# Patient Record
Sex: Female | Born: 1955 | Race: White | Hispanic: No | Marital: Married | Smoking: Never smoker
Health system: Southern US, Community
[De-identification: ages and names within clinical notes are randomized; demographics above are authoritative.]

## PROBLEM LIST (undated history)

## (undated) DIAGNOSIS — M542 Cervicalgia: Secondary | ICD-10-CM

## (undated) DIAGNOSIS — N289 Disorder of kidney and ureter, unspecified: Secondary | ICD-10-CM

## (undated) DIAGNOSIS — M79603 Pain in arm, unspecified: Secondary | ICD-10-CM

## (undated) DIAGNOSIS — E119 Type 2 diabetes mellitus without complications: Secondary | ICD-10-CM

## (undated) DIAGNOSIS — I10 Essential (primary) hypertension: Secondary | ICD-10-CM

## (undated) DIAGNOSIS — K219 Gastro-esophageal reflux disease without esophagitis: Secondary | ICD-10-CM

## (undated) DIAGNOSIS — J45909 Unspecified asthma, uncomplicated: Secondary | ICD-10-CM

## (undated) DIAGNOSIS — R519 Headache, unspecified: Secondary | ICD-10-CM

## (undated) DIAGNOSIS — I471 Supraventricular tachycardia, unspecified: Secondary | ICD-10-CM

## (undated) DIAGNOSIS — E785 Hyperlipidemia, unspecified: Secondary | ICD-10-CM

## (undated) HISTORY — PX: APPENDECTOMY: SHX54

## (undated) HISTORY — DX: Disorder of kidney and ureter, unspecified: N28.9

## (undated) HISTORY — DX: Gastro-esophageal reflux disease without esophagitis: K21.9

## (undated) HISTORY — DX: Cervicalgia: M54.2

## (undated) HISTORY — DX: Supraventricular tachycardia, unspecified: I47.10

## (undated) HISTORY — DX: Headache, unspecified: R51.9

## (undated) HISTORY — PX: ABDOMINAL HYSTERECTOMY: SHX81

## (undated) HISTORY — DX: Supraventricular tachycardia: I47.1

## (undated) HISTORY — DX: Pain in arm, unspecified: M79.603

## (undated) HISTORY — PX: CHOLECYSTECTOMY: SHX55

## (undated) HISTORY — PX: TONSILLECTOMY: SUR1361

## (undated) HISTORY — DX: Hyperlipidemia, unspecified: E78.5

---

## 1998-11-16 ENCOUNTER — Other Ambulatory Visit: Admission: RE | Admit: 1998-11-16 | Discharge: 1998-11-16 | Payer: Self-pay | Admitting: Obstetrics and Gynecology

## 2002-09-14 ENCOUNTER — Other Ambulatory Visit: Admission: RE | Admit: 2002-09-14 | Discharge: 2002-09-14 | Payer: Self-pay | Admitting: Obstetrics and Gynecology

## 2008-08-03 ENCOUNTER — Emergency Department (HOSPITAL_BASED_OUTPATIENT_CLINIC_OR_DEPARTMENT_OTHER): Admission: EM | Admit: 2008-08-03 | Discharge: 2008-08-03 | Payer: Self-pay | Admitting: Emergency Medicine

## 2008-08-03 ENCOUNTER — Ambulatory Visit: Payer: Self-pay | Admitting: Diagnostic Radiology

## 2009-04-20 IMAGING — US MAMMO-LUNI-US
1 series · 7 of 7 positions shown · non-contrast
Comparison: NONE

CLINICAL DATA: Ahmui Alom, RT(R)(M)   Diagnostic 
Mammogram.  

LEFT BREAST MAMMOGRAM ADDITIONAL VIEWS AND LEFT BREAST ULTRASOUND

[Series 1: us breast · 0.06mm/px · 7 of 7 slices shown]
[im 1/7]
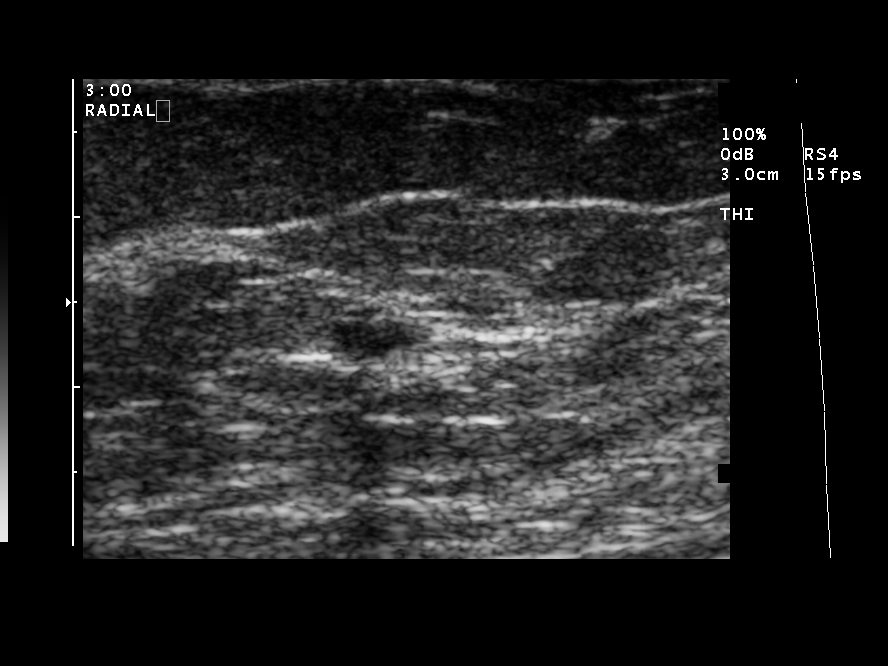
[im 2/7]
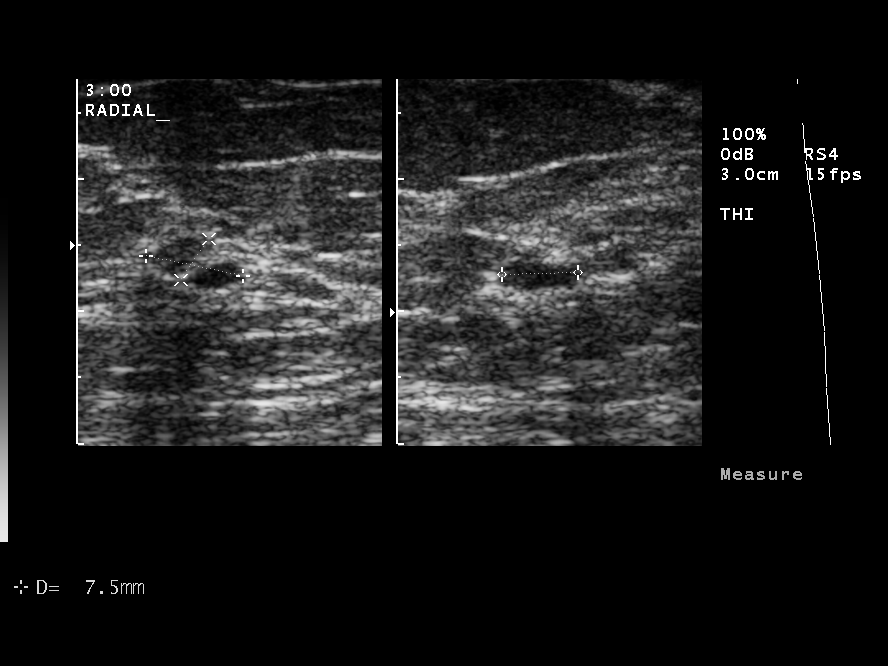
[im 3/7]
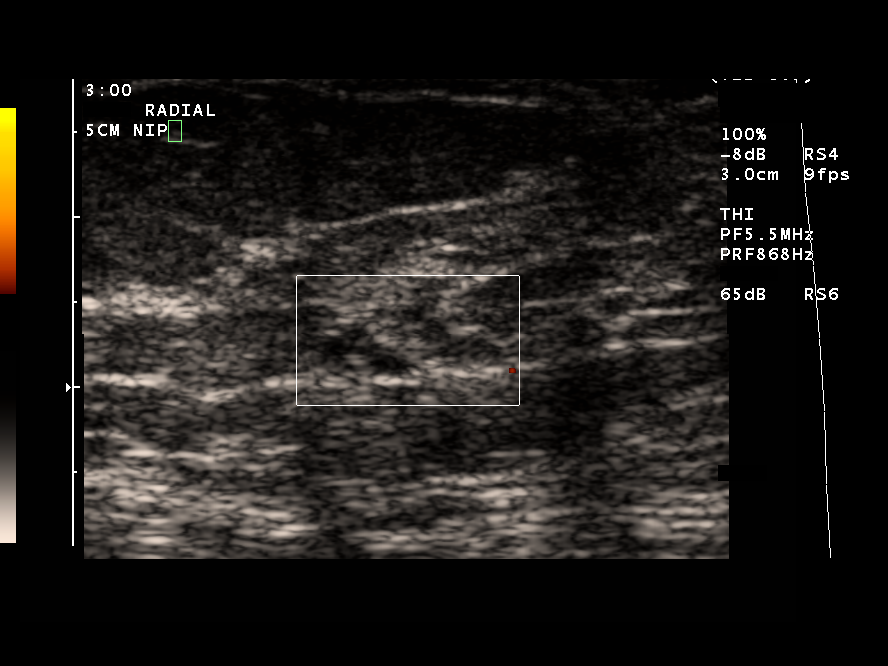
[im 4/7]
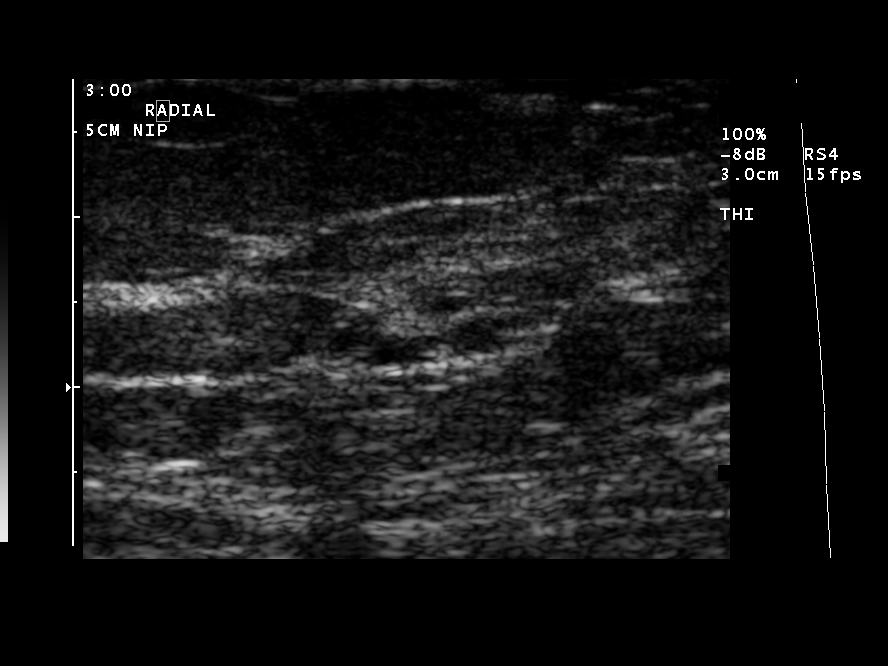
[im 5/7]
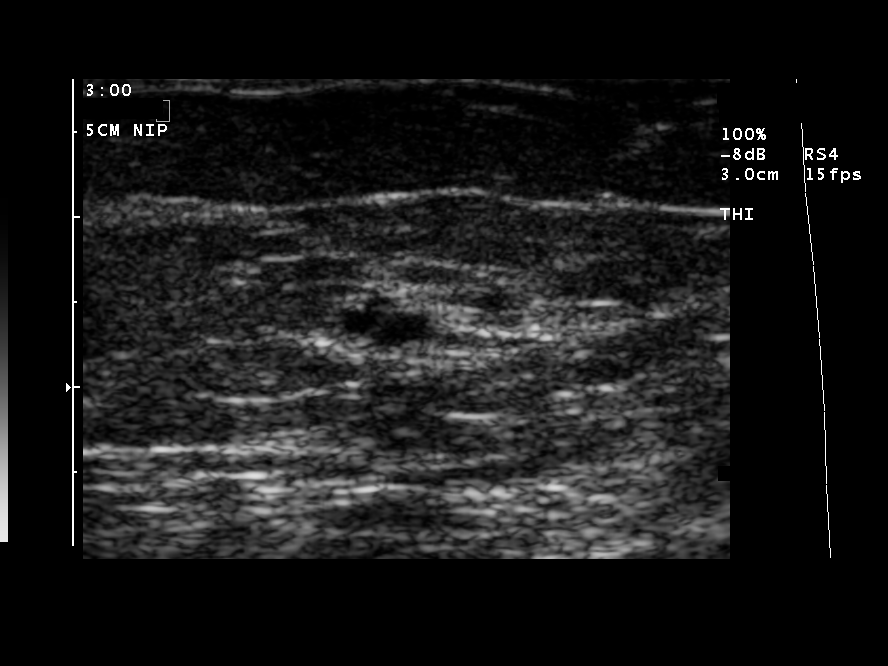
[im 6/7]
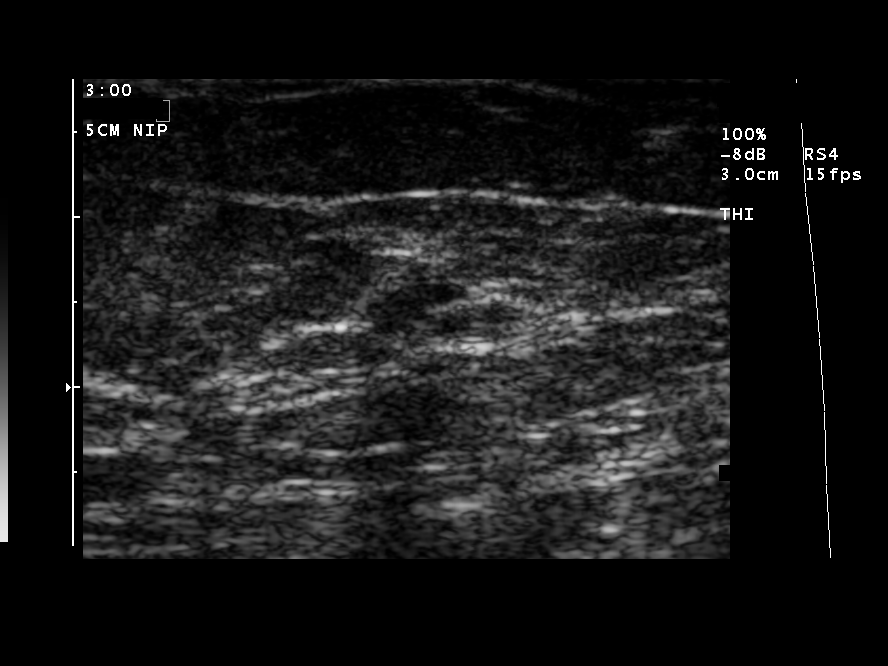
[im 7/7]
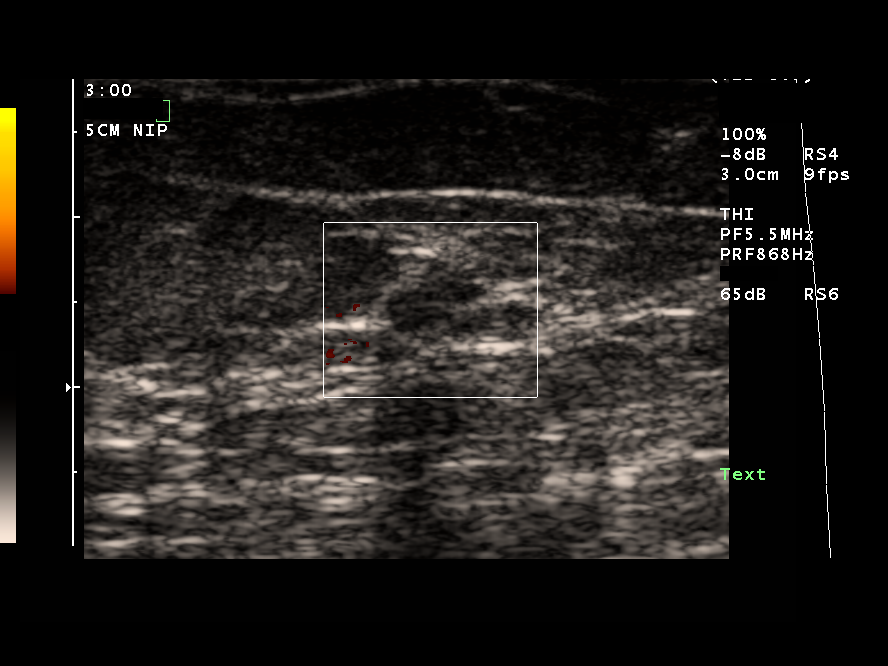

[7 of 7 positions shown; findings below may reference images not displayed]

FINDINGS: Tissue density is moderate.  Compression spot films of 
the upper portion of the left breast were obtained in the MLO 
projection as well as a true lateral.  The questioned density 
noted on 11-03-06 is no longer identifiable as a conspicuous 
abnormality.  Compression spot film was obtained in the CC 
projection of the lateral aspect of the left breast.  The density 
noted on 11-03-06 does not persist.  The appearance is no very 
similar to 7222 and 2447. Left breast ultrasound was performed.  
No masses characteristic of a carcinoma are identified.  No solid 
masses are seen.
IMPRESSION: Negative.  Recommend follow-up screening mammography 
in one year. The patient was informed at the time of the 
examination of the findings and recommendations by verbal and 
written lay report. Computer assisted (Second Look) technology was 
used as an aid in interpretation of this study. BI-RADS 1: 
Negative. Aujla, Danii electronically reviewed on 
11/25/2006 Dict Date: 11/24/2006  Tran Date: 11/25/2006 ZAWATI GARONG

## 2010-07-09 LAB — POCT CARDIAC MARKERS: Myoglobin, poc: 53.2 ng/mL (ref 12–200)

## 2010-07-09 LAB — CBC
MCHC: 33 g/dL (ref 30.0–36.0)
MCV: 91.3 fL (ref 78.0–100.0)
Platelets: 310 10*3/uL (ref 150–400)
WBC: 7 10*3/uL (ref 4.0–10.5)

## 2010-07-09 LAB — DIFFERENTIAL
Basophils Relative: 1 % (ref 0–1)
Eosinophils Absolute: 0 10*3/uL (ref 0.0–0.7)
Lymphs Abs: 1.1 10*3/uL (ref 0.7–4.0)
Neutro Abs: 5.6 10*3/uL (ref 1.7–7.7)
Neutrophils Relative %: 79 % — ABNORMAL HIGH (ref 43–77)

## 2010-07-09 LAB — BASIC METABOLIC PANEL
BUN: 9 mg/dL (ref 6–23)
Calcium: 8.8 mg/dL (ref 8.4–10.5)
Chloride: 103 mEq/L (ref 96–112)
Creatinine, Ser: 0.8 mg/dL (ref 0.4–1.2)

## 2015-05-08 ENCOUNTER — Emergency Department (HOSPITAL_COMMUNITY)
Admission: EM | Admit: 2015-05-08 | Discharge: 2015-05-08 | Disposition: A | Discharge status: Home / Self Care | Payer: BLUE CROSS/BLUE SHIELD | Attending: Emergency Medicine | Admitting: Emergency Medicine

## 2015-05-08 ENCOUNTER — Encounter (HOSPITAL_COMMUNITY): Payer: Self-pay | Admitting: Emergency Medicine

## 2015-05-08 DIAGNOSIS — R51 Headache: Secondary | ICD-10-CM | POA: Diagnosis not present

## 2015-05-08 DIAGNOSIS — R110 Nausea: Secondary | ICD-10-CM | POA: Diagnosis not present

## 2015-05-08 DIAGNOSIS — E119 Type 2 diabetes mellitus without complications: Secondary | ICD-10-CM | POA: Insufficient documentation

## 2015-05-08 DIAGNOSIS — I10 Essential (primary) hypertension: Secondary | ICD-10-CM | POA: Diagnosis not present

## 2015-05-08 DIAGNOSIS — R42 Dizziness and giddiness: Secondary | ICD-10-CM | POA: Diagnosis present

## 2015-05-08 DIAGNOSIS — J45909 Unspecified asthma, uncomplicated: Secondary | ICD-10-CM | POA: Insufficient documentation

## 2015-05-08 DIAGNOSIS — R531 Weakness: Secondary | ICD-10-CM | POA: Insufficient documentation

## 2015-05-08 HISTORY — DX: Unspecified asthma, uncomplicated: J45.909

## 2015-05-08 HISTORY — DX: Type 2 diabetes mellitus without complications: E11.9

## 2015-05-08 HISTORY — DX: Essential (primary) hypertension: I10

## 2015-05-08 LAB — CBC WITH DIFFERENTIAL/PLATELET
Basophils Absolute: 0 10*3/uL (ref 0.0–0.1)
Basophils Relative: 0 %
Eosinophils Absolute: 0 10*3/uL (ref 0.0–0.7)
Eosinophils Relative: 1 %
HCT: 44 % (ref 36.0–46.0)
Hemoglobin: 15 g/dL (ref 12.0–15.0)
Lymphocytes Relative: 21 %
Lymphs Abs: 1.3 10*3/uL (ref 0.7–4.0)
MCH: 30.9 pg (ref 26.0–34.0)
MCHC: 34.1 g/dL (ref 30.0–36.0)
MCV: 90.5 fL (ref 78.0–100.0)
Monocytes Absolute: 0.5 10*3/uL (ref 0.1–1.0)
Monocytes Relative: 8 %
Neutro Abs: 4.4 10*3/uL (ref 1.7–7.7)
Neutrophils Relative %: 70 %
Platelets: 266 10*3/uL (ref 150–400)
RBC: 4.86 MIL/uL (ref 3.87–5.11)
RDW: 12.6 % (ref 11.5–15.5)
WBC: 6.2 10*3/uL (ref 4.0–10.5)

## 2015-05-08 LAB — BASIC METABOLIC PANEL
Anion gap: 9 (ref 5–15)
BUN: 10 mg/dL (ref 6–20)
CO2: 29 mmol/L (ref 22–32)
Calcium: 9.5 mg/dL (ref 8.9–10.3)
Chloride: 103 mmol/L (ref 101–111)
Creatinine, Ser: 0.84 mg/dL (ref 0.44–1.00)
GFR calc Af Amer: >60 mL/min (ref >60)
GFR calc non Af Amer: >60 mL/min (ref >60)
Glucose, Bld: 99 mg/dL (ref 65–99)
Potassium: 3.7 mmol/L (ref 3.5–5.1)
Sodium: 141 mmol/L (ref 135–145)

## 2015-05-08 LAB — CBG MONITORING, ED: Glucose-Capillary: 101 mg/dL — ABNORMAL HIGH (ref 65–99)

## 2015-05-08 LAB — TROPONIN I: Troponin I: <0.03 ng/mL (ref <0.031)

## 2015-05-08 MED ORDER — KETOROLAC TROMETHAMINE 15 MG/ML IJ SOLN
15.0000 mg | Freq: Once | INTRAMUSCULAR | Status: AC
  Administered 2015-05-08: 15 mg via INTRAVENOUS
  Filled 2015-05-08: qty 1

## 2015-05-08 MED ORDER — DIPHENHYDRAMINE HCL 50 MG/ML IJ SOLN
12.5000 mg | Freq: Once | INTRAMUSCULAR | Status: AC
  Administered 2015-05-08: 12.5 mg via INTRAVENOUS
  Filled 2015-05-08: qty 1

## 2015-05-08 MED ORDER — SODIUM CHLORIDE 0.9 % IV BOLUS (SEPSIS)
1000.0000 mL | Freq: Once | INTRAVENOUS | Status: AC
  Administered 2015-05-08: 1000 mL via INTRAVENOUS

## 2015-05-08 MED ORDER — ONDANSETRON HCL 4 MG/2ML IJ SOLN
4.0000 mg | Freq: Once | INTRAMUSCULAR | Status: AC
  Administered 2015-05-08: 4 mg via INTRAVENOUS
  Filled 2015-05-08: qty 2

## 2015-05-08 MED ORDER — PROCHLORPERAZINE EDISYLATE 5 MG/ML IJ SOLN
10.0000 mg | Freq: Four times a day (QID) | INTRAMUSCULAR | Status: DC | PRN
  Administered 2015-05-08: 10 mg via INTRAVENOUS
  Filled 2015-05-08: qty 2

## 2015-05-08 NOTE — Discharge Instructions (Signed)

## 2015-05-08 NOTE — ED Notes (Signed)
Pt to ER via [facility] with complaint of dizziness, n/v, and headache that began at 10 am this morning. Pt checked CBG, was 98. Pt has hx of asthma, HTN, high cholesterol, and was recently diagnosed.  Pt reports generalized weakness to entire body. No neuro deficits noted at this time. A/O x4. VS per EMS - 150/100, RR 16, 90 bpm, 99% RA, last CBG 118

## 2015-05-08 NOTE — ED Provider Notes (Signed)
CSN: [id number]     Arrival date & time 05/08/15  1246 History   First MD Initiated Contact with Patient 05/08/15 1246     Chief Complaint  Patient presents with  . Weakness  . Headache  . Dizziness     (Consider location/radiation/quality/duration/timing/severity/associated sxs/prior Treatment) HPI   [age]-year-old female presenting with dizziness. Onset while at work. Patient states that she felt dizzy as if she may pass out. She did not lose consciousness but had to sit down. When she went to get back up she had repeat symptoms and then subsequently laid down on the floor. Denies any pain at that time but currently has a mild headache. Feels generally weak. Denies any focal deficits. No acute visual changes. Nausea, but no vomiting. No respiratory complaints.  Past Medical History  Diagnosis Date  . Diabetes mellitus without complication (HCC)   . Hypertension   . Asthma    History reviewed. No pertinent past surgical history. History reviewed. No pertinent family history. Social History  Substance Use Topics  . Smoking status: Never Smoker   . Smokeless tobacco: None  . Alcohol Use: No   OB History    No data available     Review of Systems  All systems reviewed and negative, other than as noted in HPI.   Allergies  Effexor xr and Nexium i.v.  Home Medications   Prior to Admission medications   Not on File   BP 159/95 mmHg  Pulse 80  Temp(Src) 98 F (36.7 C) (Oral)  Resp 14  SpO2 100% Physical Exam  Constitutional: She is oriented to person, place, and time. She appears well-developed and well-nourished. No distress.  HENT:  Head: Normocephalic and atraumatic.  Eyes: Conjunctivae are normal. Right eye exhibits no discharge. Left eye exhibits no discharge.  Neck: Neck supple.  Cardiovascular: Normal rate, regular rhythm and normal heart sounds.  Exam reveals no gallop and no friction rub.   No murmur heard. Pulmonary/Chest: Effort normal and breath  sounds normal. No respiratory distress.  Abdominal: Soft. She exhibits no distension. There is no tenderness.  Musculoskeletal: She exhibits no edema or tenderness.  Neurological: She is alert and oriented to person, place, and time. No cranial nerve deficit. She exhibits normal muscle tone. Coordination normal.  Skin: Skin is warm and dry.  Psychiatric: Thought content normal.  Poor eye contact. Crying at times.   Nursing note and vitals reviewed.   ED Course  Procedures (including critical care time) Labs Review Labs Reviewed  CBG MONITORING, ED - Abnormal; Notable for the following:    Glucose-Capillary 101 (*)    All other components within normal limits  CBC WITH DIFFERENTIAL/PLATELET  BASIC METABOLIC PANEL  TROPONIN I    Imaging Review No results found. I have personally reviewed and evaluated these images and lab results as part of my medical decision-making.   EKG Interpretation   Date/Time:  Tuesday May 08 2015 12:58:38 EST Ventricular Rate:  83 PR Interval:  140 QRS Duration: 80 QT Interval:  363 QTC Calculation: 426 R Axis:   77 Text Interpretation:  Sinus rhythm No significant change since last  tracing Confirmed by Treylin Burtch  MD, Sean Malinowski ([id number]) on 05/08/2015 1:28:42 PM      MDM   Final diagnoses:  Dizziness    59yF with dizziness. Nontoxic. Reassuring work-up and exam. Pt crying at times. Denies depressive symptoms.  "I cry when I don't feel well." She did not seem to want to discuss further and I  didn't press it. I generally have a low suspicion for emergent process. Discuss return precautions.     Raeford Razor, MD 05/09/15 [phone]

## 2015-05-08 NOTE — ED Notes (Signed)
Pt also reports right hand feels "tight"; denies shortness of breath or chest pain.

## 2015-05-13 DIAGNOSIS — E559 Vitamin D deficiency, unspecified: Secondary | ICD-10-CM

## 2015-05-13 DIAGNOSIS — I10 Essential (primary) hypertension: Secondary | ICD-10-CM | POA: Insufficient documentation

## 2015-05-13 DIAGNOSIS — L219 Seborrheic dermatitis, unspecified: Secondary | ICD-10-CM

## 2015-05-13 DIAGNOSIS — L659 Nonscarring hair loss, unspecified: Secondary | ICD-10-CM

## 2015-05-13 DIAGNOSIS — R7303 Prediabetes: Secondary | ICD-10-CM

## 2015-05-13 DIAGNOSIS — K2100 Gastro-esophageal reflux disease with esophagitis, without bleeding: Secondary | ICD-10-CM | POA: Insufficient documentation

## 2015-05-13 DIAGNOSIS — J3089 Other allergic rhinitis: Secondary | ICD-10-CM

## 2015-05-13 DIAGNOSIS — I471 Supraventricular tachycardia, unspecified: Secondary | ICD-10-CM

## 2015-05-13 DIAGNOSIS — J45909 Unspecified asthma, uncomplicated: Secondary | ICD-10-CM | POA: Insufficient documentation

## 2015-05-13 DIAGNOSIS — E785 Hyperlipidemia, unspecified: Secondary | ICD-10-CM | POA: Insufficient documentation

## 2015-05-13 HISTORY — DX: Prediabetes: R73.03

## 2015-05-13 HISTORY — DX: Seborrheic dermatitis, unspecified: L21.9

## 2015-05-13 HISTORY — DX: Other allergic rhinitis: J30.89

## 2015-05-13 HISTORY — DX: Supraventricular tachycardia, unspecified: I47.10

## 2015-05-13 HISTORY — DX: Essential (primary) hypertension: I10

## 2015-05-13 HISTORY — DX: Nonscarring hair loss, unspecified: L65.9

## 2015-05-13 HISTORY — DX: Vitamin D deficiency, unspecified: E55.9

## 2015-06-12 ENCOUNTER — Encounter: Payer: Self-pay | Admitting: Cardiology

## 2015-06-12 ENCOUNTER — Ambulatory Visit (INDEPENDENT_AMBULATORY_CARE_PROVIDER_SITE_OTHER): Payer: BLUE CROSS/BLUE SHIELD | Admitting: Cardiology

## 2015-06-12 VITALS — BP 140/90 | HR 95 | Ht 63.0 in | Wt 164.0 lb

## 2015-06-12 DIAGNOSIS — R079 Chest pain, unspecified: Secondary | ICD-10-CM

## 2015-06-12 DIAGNOSIS — I10 Essential (primary) hypertension: Secondary | ICD-10-CM

## 2015-06-12 DIAGNOSIS — I471 Supraventricular tachycardia: Secondary | ICD-10-CM

## 2015-06-12 DIAGNOSIS — R072 Precordial pain: Secondary | ICD-10-CM | POA: Diagnosis not present

## 2015-06-12 DIAGNOSIS — R002 Palpitations: Secondary | ICD-10-CM

## 2015-06-12 DIAGNOSIS — R42 Dizziness and giddiness: Secondary | ICD-10-CM | POA: Insufficient documentation

## 2015-06-12 HISTORY — DX: Chest pain, unspecified: R07.9

## 2015-06-12 HISTORY — DX: Palpitations: R00.2

## 2015-06-12 HISTORY — DX: Dizziness and giddiness: R42

## 2015-06-12 NOTE — Assessment & Plan Note (Signed)
Etiology unclear. Plan is monitor to further assess.

## 2015-06-12 NOTE — Patient Instructions (Signed)
Medication Instructions:   NO CHANGE  Testing/Procedures:  Your physician has requested that you have an echocardiogram. Echocardiography is a painless test that uses sound waves to create images of your heart. It provides your doctor with information about the size and shape of your heart and how well your heart's chambers and valves are working. This procedure takes approximately one hour. There are no restrictions for this procedure.   Your physician has recommended that you wear an event monitor. Event monitors are medical devices that record the heart's electrical activity. Doctors most often us these monitors to diagnose arrhythmias. Arrhythmias are problems with the speed or rhythm of the heartbeat. The monitor is a small, portable device. You can wear one while you do your normal daily activities. This is usually used to diagnose what is causing palpitations/syncope (passing out).   Your physician has requested that you have an exercise tolerance test. For further information please visit [email or web address]. Please also follow instruction sheet, as given.    Follow-Up:  Your physician recommends that you schedule a follow-up appointment in: 8 WEEKS WITH DR Jens SomRENSHAW   Exercise Stress Electrocardiogram An exercise stress electrocardiogram is a test to check how blood flows to your heart. It is done to find areas of poor blood flow. You will need to walk on a treadmill for this test. The electrocardiogram will record your heartbeat when you are at rest and when you are exercising. BEFORE THE PROCEDURE  Do not have drinks with caffeine or foods with caffeine for 24 hours before the test, or as told by your doctor. This includes coffee, tea (even decaf tea), sodas, chocolate, and cocoa.  Follow your doctor's instructions about eating and drinking before the test.  Ask your doctor what medicines you should or should not take before the test. Take your medicines with water unless told by  your doctor not to.  If you use an inhaler, bring it with you to the test.  Bring a snack to eat after the test.  Do not  smoke for 4 hours before the test.  Do not put lotions, powders, creams, or oils on your chest before the test.  Wear comfortable shoes and clothing. PROCEDURE  You will have patches put on your chest. Small areas of your chest may need to be shaved. Wires will be connected to the patches.  Your heart rate will be watched while you are resting and while you are exercising.  You will walk on the treadmill. The treadmill will slowly get faster to raise your heart rate.  The test will take about 1-2 hours. AFTER THE PROCEDURE  Your heart rate and blood pressure will be watched after the test.  You may return to your normal diet, activities, and medicines or as told by your doctor.   This information is not intended to replace advice given to you by your health care provider. Make sure you discuss any questions you have with your health care provider.   Document Released: 09/03/2007 Document Revised: 04/07/2014 Document Reviewed: 11/22/2012 Elsevier Interactive Patient Education [employer]2016 Elsevier Inc.

## 2015-06-12 NOTE — Assessment & Plan Note (Signed)
Continue verapamil. Previously diagnosed and aspirin.

## 2015-06-12 NOTE — Assessment & Plan Note (Signed)
Continue present blood pressure medications. 

## 2015-06-12 NOTE — Assessment & Plan Note (Signed)
Symptoms atypical. Plan exercise treadmill for risk stratification. 

## 2015-06-12 NOTE — Assessment & Plan Note (Signed)
Plan echocardiogram and monitor.

## 2015-06-12 NOTE — Progress Notes (Signed)
HPI: 60 year old female for evaluation of near syncope. Laboratories February 2017 showed normal hemoglobin, renal function, potassium and troponin. Patient seen in the emergency room in February with dizziness and near syncope. Patient has had episodes of dizziness for years.  These occur suddenly and are described as being "agitated", associated dry mouth, nauseated, dyspneic and dizzy. She has not had recent syncope. They can last 15 minutes up to 4 hours. She notes associated palpitations. No chest pain. She notes some dyspnea on exertion but no orthopnea, PND, pedal edema. She has occasional aching pain for several minutes after walking but not during. Because of the above we were asked to evaluate.  Current Outpatient Prescriptions  Medication Sig Dispense Refill  . ALPRAZolam (XANAX) 0.25 MG tablet Take 0.25 mg by mouth at bedtime as needed for anxiety.    . Cholecalciferol (VITAMIN D) 2000 units tablet Take 2,000 Units by mouth daily.    Marland Kitchen. levalbuterol (XOPENEX HFA) 45 MCG/ACT inhaler Inhale 2 puffs into the lungs every 4 (four) hours as needed for wheezing.    . mometasone-formoterol (DULERA) 100-5 MCG/ACT AERO Inhale 2 puffs into the lungs 2 (two) times daily.    . ranitidine (ZANTAC) 150 MG capsule Take 150 mg by mouth as needed.     . verapamil (VERELAN PM) 240 MG 24 hr capsule Take 240 mg by mouth at bedtime.     No current facility-administered medications for this visit.    Allergies  Allergen Reactions  . Effexor Xr [Venlafaxine Hcl]   . Nexium I.V. [Esomeprazole Sodium] Rash     Past Medical History  Diagnosis Date  . Diabetes mellitus without complication (HCC)   . Hypertension   . Asthma   . Hyperlipidemia   . GERD (gastroesophageal reflux disease)   . SVT (supraventricular tachycardia) (HCC)   . Renal insufficiency     Past Surgical History  Procedure Laterality Date  . Tonsillectomy    . Appendectomy    . Abdominal hysterectomy    . Cholecystectomy        Social History   Social History  . Marital Status: Married    Spouse Name: N/A  . Number of Children: 2  . Years of Education: N/A   Occupational History  . Not on file.   Social History Main Topics  . Smoking status: Never Smoker   . Smokeless tobacco: Never Used  . Alcohol Use: No  . Drug Use: No  . Sexual Activity: Not on file   Other Topics Concern  . Not on file   Social History Narrative    Family History  Problem Relation Age of Onset  . Diabetes Mother   . Stroke Mother   . Hypertension Mother   . Alzheimer's disease Mother   . Heart failure Mother   . Heart failure Father   . Heart disease Father   . Diabetes Father   . Hypertension Father   . Heart failure Brother   . Hypertension Brother   . Heart disease Brother     ROS: no fevers or chills, productive cough, hemoptysis, dysphasia, odynophagia, melena, hematochezia, dysuria, hematuria, rash, seizure activity, orthopnea, PND, pedal edema, claudication. Remaining systems are negative.  Physical Exam:   Blood pressure 140/90, pulse 95, height 5\' 3"  (1.6 m), weight 164 lb (74.39 kg).  General:  Well developed/well nourished in NAD Skin warm/dry Patient not depressed No peripheral clubbing Back-normal HEENT-normal/normal eyelids Neck supple/normal carotid upstroke bilaterally; no bruits; no JVD; no thyromegaly  chest - CTA/ normal expansion CV - RRR/normal S1 and S2; no murmurs, rubs or gallops;  PMI nondisplaced Abdomen -NT/ND, no HSM, no mass, + bowel sounds, no bruit 2+ femoral pulses, no bruits Ext-no edema, chords, 2+ DP Neuro-grossly nonfocal  ECG 05/08/2015-sinus rhythm with RV conduction delay.

## 2015-06-19 ENCOUNTER — Encounter: Payer: Self-pay | Admitting: Cardiology

## 2015-06-27 ENCOUNTER — Ambulatory Visit (INDEPENDENT_AMBULATORY_CARE_PROVIDER_SITE_OTHER): Payer: BLUE CROSS/BLUE SHIELD

## 2015-06-27 ENCOUNTER — Other Ambulatory Visit: Payer: Self-pay

## 2015-06-27 ENCOUNTER — Ambulatory Visit (HOSPITAL_COMMUNITY): Payer: BLUE CROSS/BLUE SHIELD | Attending: Cardiovascular Disease

## 2015-06-27 DIAGNOSIS — R002 Palpitations: Secondary | ICD-10-CM | POA: Diagnosis not present

## 2015-06-27 DIAGNOSIS — I358 Other nonrheumatic aortic valve disorders: Secondary | ICD-10-CM | POA: Insufficient documentation

## 2015-06-27 DIAGNOSIS — I059 Rheumatic mitral valve disease, unspecified: Secondary | ICD-10-CM | POA: Insufficient documentation

## 2015-06-27 DIAGNOSIS — R072 Precordial pain: Secondary | ICD-10-CM | POA: Diagnosis not present

## 2015-06-27 DIAGNOSIS — R079 Chest pain, unspecified: Secondary | ICD-10-CM | POA: Diagnosis present

## 2015-06-27 DIAGNOSIS — I10 Essential (primary) hypertension: Secondary | ICD-10-CM | POA: Diagnosis not present

## 2015-06-28 ENCOUNTER — Telehealth (HOSPITAL_COMMUNITY): Payer: Self-pay

## 2015-06-28 NOTE — Telephone Encounter (Signed)
Encounter complete. 

## 2015-07-01 DIAGNOSIS — R55 Syncope and collapse: Secondary | ICD-10-CM | POA: Insufficient documentation

## 2015-07-01 HISTORY — DX: Syncope and collapse: R55

## 2015-07-03 ENCOUNTER — Ambulatory Visit (HOSPITAL_COMMUNITY)
Admission: RE | Admit: 2015-07-03 | Discharge: 2015-07-03 | Disposition: A | Discharge status: Home / Self Care | Payer: BLUE CROSS/BLUE SHIELD | Source: Ambulatory Visit | Attending: Cardiovascular Disease | Admitting: Cardiovascular Disease

## 2015-07-03 DIAGNOSIS — R072 Precordial pain: Secondary | ICD-10-CM | POA: Insufficient documentation

## 2015-07-03 LAB — EXERCISE TOLERANCE TEST
CHL CUP RESTING HR STRESS: 92 {beats}/min
CHL CUP STRESS STAGE 1 DBP: 83 mmHg
CHL CUP STRESS STAGE 1 HR: 90 {beats}/min
CHL CUP STRESS STAGE 1 SBP: 138 mmHg
CHL CUP STRESS STAGE 3 GRADE: 0.1 %
CHL CUP STRESS STAGE 3 SPEED: 1 mph
CHL CUP STRESS STAGE 4 GRADE: 10 %
CHL CUP STRESS STAGE 4 HR: 125 {beats}/min
CHL CUP STRESS STAGE 4 SBP: 149 mmHg
CHL CUP STRESS STAGE 5 DBP: 61 mmHg
CHL CUP STRESS STAGE 5 HR: 151 {beats}/min
CHL CUP STRESS STAGE 5 SBP: 170 mmHg
CHL CUP STRESS STAGE 6 HR: 166 {beats}/min
CHL CUP STRESS STAGE 7 SPEED: 0 mph
CHL CUP STRESS STAGE 8 GRADE: 0 %
CHL CUP STRESS STAGE 8 HR: 105 {beats}/min
CHL CUP STRESS STAGE 8 SBP: 151 mmHg
CHL CUP STRESS STAGE 8 SPEED: 0 mph
CHL RATE OF PERCEIVED EXERTION: 17
CSEPED: 7 min
Estimated workload: 8.5 METS
MPHR: 160 {beats}/min
Peak HR: 166 {beats}/min
Percent HR: 106 %
Percent of predicted max HR: 103 %
Stage 1 Grade: 0 %
Stage 1 Speed: 0 mph
Stage 2 Grade: 0 %
Stage 2 HR: 92 {beats}/min
Stage 2 Speed: 1 mph
Stage 3 HR: 92 {beats}/min
Stage 4 DBP: 95 mmHg
Stage 4 Speed: 1.7 mph
Stage 5 Grade: 12 %
Stage 5 Speed: 2.5 mph
Stage 6 Grade: 14 %
Stage 6 Speed: 3.4 mph
Stage 7 DBP: 71 mmHg
Stage 7 Grade: 0 %
Stage 7 HR: 151 {beats}/min
Stage 7 SBP: 185 mmHg
Stage 8 DBP: 81 mmHg

## 2015-07-17 DIAGNOSIS — K219 Gastro-esophageal reflux disease without esophagitis: Secondary | ICD-10-CM | POA: Diagnosis not present

## 2015-07-17 DIAGNOSIS — E1122 Type 2 diabetes mellitus with diabetic chronic kidney disease: Secondary | ICD-10-CM | POA: Diagnosis not present

## 2015-07-17 DIAGNOSIS — N182 Chronic kidney disease, stage 2 (mild): Secondary | ICD-10-CM | POA: Diagnosis not present

## 2015-07-17 DIAGNOSIS — E782 Mixed hyperlipidemia: Secondary | ICD-10-CM | POA: Diagnosis not present

## 2015-07-17 DIAGNOSIS — Z79899 Other long term (current) drug therapy: Secondary | ICD-10-CM | POA: Diagnosis not present

## 2015-07-26 DIAGNOSIS — Z1231 Encounter for screening mammogram for malignant neoplasm of breast: Secondary | ICD-10-CM | POA: Diagnosis not present

## 2015-08-02 NOTE — Progress Notes (Signed)
      HPI: FU near syncope. Laboratories February 2017 showed normal hemoglobin, renal function, potassium and troponin. Monitor March 2017 showed sinus rhythm. Echocardiogram March 2017 showed normal LV systolic function, grade 1 diastolic dysfunction. Exercise treadmill April 2017 negative. Since last seen, the patient denies any dyspnea on exertion, orthopnea, PND, pedal edema, palpitations, syncope or chest pain.   Current Outpatient Prescriptions  Medication Sig Dispense Refill  . ALPRAZolam (XANAX) 0.25 MG tablet Take 0.25 mg by mouth at bedtime as needed for anxiety.    . Cholecalciferol (VITAMIN D) 2000 units tablet Take 2,000 Units by mouth daily.    Marland Kitchen. levalbuterol (XOPENEX HFA) 45 MCG/ACT inhaler Inhale 2 puffs into the lungs every 4 (four) hours as needed for wheezing.    . mometasone-formoterol (DULERA) 100-5 MCG/ACT AERO Inhale 2 puffs into the lungs 2 (two) times daily.    . ranitidine (ZANTAC) 150 MG capsule Take 150 mg by mouth as needed.     . verapamil (VERELAN PM) 240 MG 24 hr capsule Take 240 mg by mouth at bedtime.     No current facility-administered medications for this visit.     Past Medical History  Diagnosis Date  . Diabetes mellitus without complication (HCC)   . Hypertension   . Asthma   . Hyperlipidemia   . GERD (gastroesophageal reflux disease)   . SVT (supraventricular tachycardia) (HCC)   . Renal insufficiency     Past Surgical History  Procedure Laterality Date  . Tonsillectomy    . Appendectomy    . Abdominal hysterectomy    . Cholecystectomy      Social History   Social History  . Marital Status: Married    Spouse Name: N/A  . Number of Children: 2  . Years of Education: N/A   Occupational History  . Not on file.   Social History Main Topics  . Smoking status: Never Smoker   . Smokeless tobacco: Never Used  . Alcohol Use: No  . Drug Use: No  . Sexual Activity: Not on file   Other Topics Concern  . Not on file   Social  History Narrative    Family History  Problem Relation Age of Onset  . Diabetes Mother   . Stroke Mother   . Hypertension Mother   . Alzheimer's disease Mother   . Heart failure Mother   . Heart failure Father   . Heart disease Father   . Diabetes Father   . Hypertension Father   . Heart failure Brother   . Hypertension Brother   . Heart disease Brother     ROS: no fevers or chills, productive cough, hemoptysis, dysphasia, odynophagia, melena, hematochezia, dysuria, hematuria, rash, seizure activity, orthopnea, PND, pedal edema, claudication. Remaining systems are negative.  Physical Exam: Well-developed well-nourished in no acute distress.  Skin is warm and dry.  HEENT is normal.  Neck is supple.  Chest is clear to auscultation with normal expansion.  Cardiovascular exam is regular rate and rhythm.  Abdominal exam nontender or distended. No masses palpated. Extremities show no edema. neuro grossly intact

## 2015-08-08 ENCOUNTER — Ambulatory Visit (INDEPENDENT_AMBULATORY_CARE_PROVIDER_SITE_OTHER): Payer: BLUE CROSS/BLUE SHIELD | Admitting: Cardiology

## 2015-08-08 ENCOUNTER — Encounter: Payer: Self-pay | Admitting: Cardiology

## 2015-08-08 VITALS — BP 140/87 | HR 82 | Ht 63.0 in | Wt 163.0 lb

## 2015-08-08 DIAGNOSIS — I471 Supraventricular tachycardia: Secondary | ICD-10-CM | POA: Diagnosis not present

## 2015-08-08 DIAGNOSIS — R42 Dizziness and giddiness: Secondary | ICD-10-CM | POA: Diagnosis not present

## 2015-08-08 DIAGNOSIS — I10 Essential (primary) hypertension: Secondary | ICD-10-CM

## 2015-08-08 DIAGNOSIS — R002 Palpitations: Secondary | ICD-10-CM

## 2015-08-08 DIAGNOSIS — R072 Precordial pain: Secondary | ICD-10-CM

## 2015-08-08 NOTE — Assessment & Plan Note (Signed)
Symptoms have resolved. Recent treadmill negative. No further evaluation.

## 2015-08-08 NOTE — Patient Instructions (Signed)
Your physician wants you to follow-up in: ONE YEAR WITH DR CRENSHAW You will receive a reminder letter in the mail two months in advance. If you don't receive a letter, please call our office to schedule the follow-up appointment.   If you need a refill on your cardiac medications before your next appointment, please call your pharmacy.  

## 2015-08-08 NOTE — Assessment & Plan Note (Signed)
Blood pressure controlled. Continue present medications. 

## 2015-08-08 NOTE — Assessment & Plan Note (Signed)
Patient did not have any symptoms with a monitor in place. Her symptoms have improved. We will follow for now.

## 2015-08-08 NOTE — Assessment & Plan Note (Signed)
No further episodes. We will follow

## 2015-08-08 NOTE — Assessment & Plan Note (Signed)
-   Continue verapamil 

## 2015-08-09 ENCOUNTER — Ambulatory Visit: Payer: BLUE CROSS/BLUE SHIELD | Admitting: Cardiology

## 2015-08-30 DIAGNOSIS — A932 Colorado tick fever: Secondary | ICD-10-CM | POA: Diagnosis not present

## 2015-08-31 DIAGNOSIS — R509 Fever, unspecified: Secondary | ICD-10-CM | POA: Diagnosis not present

## 2015-08-31 DIAGNOSIS — A932 Colorado tick fever: Secondary | ICD-10-CM | POA: Diagnosis not present

## 2015-09-17 DIAGNOSIS — R42 Dizziness and giddiness: Secondary | ICD-10-CM | POA: Diagnosis not present

## 2015-09-17 DIAGNOSIS — R002 Palpitations: Secondary | ICD-10-CM | POA: Diagnosis not present

## 2015-10-03 ENCOUNTER — Ambulatory Visit (INDEPENDENT_AMBULATORY_CARE_PROVIDER_SITE_OTHER): Payer: BLUE CROSS/BLUE SHIELD | Admitting: Neurology

## 2015-10-03 ENCOUNTER — Encounter: Payer: Self-pay | Admitting: Neurology

## 2015-10-03 VITALS — BP 150/90 | HR 81 | Ht 63.5 in | Wt 164.0 lb

## 2015-10-03 DIAGNOSIS — R42 Dizziness and giddiness: Secondary | ICD-10-CM | POA: Diagnosis not present

## 2015-10-03 NOTE — Patient Instructions (Signed)
Remember to drink plenty of fluid, eat healthy meals and do not skip any meals. Try to eat protein with a every meal and eat a healthy snack such as fruit or nuts in between meals. Try to keep a regular sleep-wake schedule and try to exercise daily, particularly in the form of walking, 20-30 minutes a day, if you can.   As far as your medications are concerned, I would like to suggest: Keep a diary of how many glasses of water you have daily, what you eat, how you slept, and when you have dizzy spells and the severity. Take blood pressure and glucose with your spells. Try to see if there is any association.   I would like to see you back after vestibular therapy if needed, sooner if we need to. Please call us with any interim questions, concerns, problems, updates or refill requests.   Our phone number is [phone]. We also have an after hours call service for urgent matters and there is a physician on-call for urgent questions. For any emergencies you know to call 911 or go to the nearest emergency room

## 2015-10-03 NOTE — Progress Notes (Signed)
[facility]    Provider:  Dr Lucia GaskinsAhern Referring Provider:  Richardson Doppcole dawn watkins NP Primary Care Physician:  Richardson Doppcole dawn watkins NP  CC:  Dizzy spells  HPI:  Dawn Perkins is a 60 y.o. female here as a referral from Dr. Maisie Fushomas for dizzy spells. Past medical history of hypertension, diabetes, high cholesterol, migraine, anxiety, supraventricular tachycardia. Episodes started several years ago when she was passing out, she hit her head a few times. She would feel sweaty and shaking and feel lightheaded and weak and would lose consciousness and she had her gallbladder removed and a hysterectomy and she improved without any more episodes of loss of consciousness. She has dizziness when she turns her head, when she turns her head or on movement. Happens in the morning when she gets up. Meclizine helps. She feels dizzy and lightheaded, the room does not spin. She has been to multiple doctors.She has an ENT. She has hearing loss.  She can feel weak and nauseated with the symptoms. She wore a heart monitor and unfortunately did not have any episodes of palpitations or dizziness while wearing it. The dizziness happens 3x a week. It lasts until she lays down or take an antivert. She can function with most of the episodes. The bad episodes occur every few months. She doesn;t keep diaries and she does not now if the bad episodes correspond with any other issues such as dehydration/not drinking enough water that day or eating. No other focal neurologic deficits. She denies dehydration. She has fatigue and anxiety. No loss of awareness during episodes. Reviewed CT of the head and patient does say that she had trauma to the head at one point on the left.  Reviewed notes, labs and imaging from outside physicians, which showed: Reviewed notes from primary care. She presented for palpitations, woke up at 12 AM with palpitations as well as being extremely restless, after having palpitations started to have  rapid shallow breaths. Initially thought she was having a panic attack and took half of his Xanax she had on hand without relief area symptoms lasted in total proximally 5 AM and finally resolved, however patient states she is extremely fatigued and her legs feel like Jell-O. History of several episodes of exactly the same symptoms sent to cardiology and had cardiac workup with event monitor, echo, stress test that were unremarkable in April 2017. Patient has had multiple provider visits and tests for palpitations, has recurrent dizziness and had a CT that showed something in the left lobe that suggested a past trauma the patient not aware of anything that happened. Has had headaches for over a year and just doesn't feel well.  Labs completed include CMP with normal creatinine 0.75, CBC unremarkable, TSH normal, magnesium normal.  CT of the head showed no acute infarct, tiny region of blood breakdown products superior left frontal lobe without change from 2009 may reflect prior episode of hemorrhagic ischemia, resulting prior trauma or tiny cavernoma. Few punctate nonspecific white matter type changes most notable in the frontal lobes, greater on the left and relatively similar to prior exam may reflect result of migraine headaches or small vessel disease.  Echocardiogram: Study Conclusions  - Left ventricle: The cavity size was normal. Wall thickness was  normal. Systolic function was normal. The estimated ejection  fraction was in the range of 60% to 65%. Wall motion was normal;  there were no regional wall motion abnormalities. Doppler  parameters are consistent with abnormal left ventricular  relaxation (grade  1 diastolic dysfunction). - Aortic valve: Trileaflet; mildly thickened, mildly calcified  leaflets. Valve mobility was restricted. - Mitral valve: Calcified annulus. Mildly thickened, mildly  calcified leaflets .  Personally reviewed EKG tracing, normal sinus rhythm with QTC 427.  Normal EKG.  Review of Systems: Patient complains of symptoms per HPI as well as the following symptoms: Weight gain, fatigue, blurred vision, anxiety, sleepiness, racing thoughts. Pertinent negatives per HPI. All others negative.   Social History   Social History  . Marital Status: Married    Spouse Name: Dawn Perkins  . Number of Children: 2  . Years of Education: 12+   Occupational History  . [employer]    Social History Main Topics  . Smoking status: Never Smoker   . Smokeless tobacco: Never Used  . Alcohol Use: No  . Drug Use: No  . Sexual Activity: Not on file   Other Topics Concern  . Not on file   Social History Narrative   Lives with spouse   Caffeine use:  1-2 coke zero per day     Family History  Problem Relation Age of Onset  . Diabetes Mother   . Stroke Mother   . Hypertension Mother   . Alzheimer's disease Mother   . Heart failure Mother   . Heart failure Father   . Heart disease Father   . Diabetes Father   . Hypertension Father   . Heart failure Brother   . Hypertension Brother   . Heart disease Brother   . Neuropathy Neg Hx   . Multiple sclerosis Neg Hx   . Migraines Neg Hx   . Ataxia Neg Hx     Past Medical History  Diagnosis Date  . Diabetes mellitus without complication (HCC)   . Hypertension   . Asthma   . Hyperlipidemia   . GERD (gastroesophageal reflux disease)   . SVT (supraventricular tachycardia) (HCC)   . Renal insufficiency     Past Surgical History  Procedure Laterality Date  . Tonsillectomy    . Appendectomy    . Abdominal hysterectomy    . Cholecystectomy      Current Outpatient Prescriptions  Medication Sig Dispense Refill  . ALPRAZolam (XANAX) 0.25 MG tablet Take 0.25 mg by mouth at bedtime as needed for anxiety.    . busPIRone (BUSPAR) 7.5 MG tablet Take 7.5 mg by mouth 2 (two) times daily.  0  . Cholecalciferol (VITAMIN D) 2000 units tablet Take 2,000 Units by mouth daily.    Marland Kitchen levalbuterol (XOPENEX  HFA) 45 MCG/ACT inhaler Inhale 2 puffs into the lungs every 4 (four) hours as needed for wheezing.    . mometasone-formoterol (DULERA) 100-5 MCG/ACT AERO Inhale 2 puffs into the lungs 2 (two) times daily.    . pravastatin (PRAVACHOL) 40 MG tablet Take 40 mg by mouth daily.  4  . ranitidine (ZANTAC) 150 MG capsule Take 150 mg by mouth as needed.     . verapamil (VERELAN PM) 240 MG 24 hr capsule Take 240 mg by mouth at bedtime.     No current facility-administered medications for this visit.    Allergies as of 10/03/2015 - Review Complete 08/08/2015  Allergen Reaction Noted  . Effexor xr [venlafaxine hcl]  05/08/2015  . Esomeprazole  08/08/2015  . Omeprazole  08/08/2015  . Tape  08/08/2015  . Venlafaxine  08/08/2015  . Nexium i.v. [esomeprazole sodium] Rash 05/08/2015    Vitals: BP 150/90 mmHg  Pulse 81  Ht 5' 3.5" (  1.613 m)  Wt 164 lb (74.39 kg)  BMI 28.59 kg/m2 Last Weight:  Wt Readings from Last 1 Encounters:  10/03/15 164 lb (74.39 kg)   Last Height:   Ht Readings from Last 1 Encounters:  10/03/15 5' 3.5" (1.613 m)    Physical exam: Exam: Gen: NAD, conversant, well nourised, overweight , well groomed                     CV: RRR, no MRG. No Carotid Bruits. No peripheral edema, warm, nontender Eyes: Conjunctivae clear without exudates or hemorrhage  Neuro: Detailed Neurologic Exam  Speech:    Speech is normal; fluent and spontaneous with normal comprehension.  Cognition:    The patient is oriented to person, place, and time;     recent and remote memory intact;     language fluent;     normal attention, concentration,     fund of knowledge Cranial Nerves:    The pupils are equal, round, and reactive to light. The fundi are normal and spontaneous venous pulsations are present. Visual fields are full to finger confrontation. Extraocular movements are intact. Trigeminal sensation is intact and the muscles of mastication are normal. The face is symmetric. The palate  elevates in the midline. Hearing intact. Voice is normal. Shoulder shrug is normal. The tongue has normal motion without fasciculations.   Coordination:    Normal finger to nose and heel to shin. Normal rapid alternating movements.   Gait:    Heel-toe and tandem gait are normal.   Motor Observation:    No asymmetry, no atrophy, and no involuntary movements noted. Tone:    Normal muscle tone.    Posture:    Posture is normal. normal erect    Strength:    Strength is V/V in the upper and lower limbs.      Sensation: intact to LT     Reflex Exam:  DTR's:    Deep tendon reflexes in the upper and lower extremities are normal bilaterally.   Toes:    The toes are downgoing bilaterally.   Clonus:    Clonus is absent.      Assessment/Plan:  60 year old with chronic dizziness/dizzy spells. Neurologic exam nonfocal.  - Needs to keep a diary when she has the significant dizzy spells, what was her blood pressure, how may glasses of water did she drink that day, did she eat, check glucose and blood pressure - Needs to keep a similar diary daily for her small spells. - Vestibular therapy.  - Recommended MRI of the brain she declined.  Cc: cole dawn watkins NP Naomie DeanAntonia Kandon Hosking, MD  [facility] [address] [place], [place] [id number]  Phone [phone] Fax [phone]

## 2015-10-04 ENCOUNTER — Encounter: Payer: Self-pay | Admitting: Neurology

## 2015-10-13 DIAGNOSIS — K5792 Diverticulitis of intestine, part unspecified, without perforation or abscess without bleeding: Secondary | ICD-10-CM | POA: Diagnosis not present

## 2015-11-19 DIAGNOSIS — E782 Mixed hyperlipidemia: Secondary | ICD-10-CM | POA: Diagnosis not present

## 2015-11-19 DIAGNOSIS — R7303 Prediabetes: Secondary | ICD-10-CM | POA: Diagnosis not present

## 2015-11-19 DIAGNOSIS — I10 Essential (primary) hypertension: Secondary | ICD-10-CM | POA: Diagnosis not present

## 2015-11-19 DIAGNOSIS — K219 Gastro-esophageal reflux disease without esophagitis: Secondary | ICD-10-CM | POA: Diagnosis not present

## 2015-11-27 DIAGNOSIS — Z86010 Personal history of colonic polyps: Secondary | ICD-10-CM | POA: Diagnosis not present

## 2015-11-27 DIAGNOSIS — Z8719 Personal history of other diseases of the digestive system: Secondary | ICD-10-CM | POA: Diagnosis not present

## 2015-11-27 DIAGNOSIS — K5900 Constipation, unspecified: Secondary | ICD-10-CM | POA: Diagnosis not present

## 2015-12-21 DIAGNOSIS — R1032 Left lower quadrant pain: Secondary | ICD-10-CM | POA: Diagnosis not present

## 2015-12-24 DIAGNOSIS — D124 Benign neoplasm of descending colon: Secondary | ICD-10-CM | POA: Diagnosis not present

## 2015-12-24 DIAGNOSIS — Z8719 Personal history of other diseases of the digestive system: Secondary | ICD-10-CM | POA: Diagnosis not present

## 2015-12-24 DIAGNOSIS — D123 Benign neoplasm of transverse colon: Secondary | ICD-10-CM | POA: Diagnosis not present

## 2015-12-24 DIAGNOSIS — Z1211 Encounter for screening for malignant neoplasm of colon: Secondary | ICD-10-CM | POA: Diagnosis not present

## 2015-12-24 DIAGNOSIS — Z86010 Personal history of colonic polyps: Secondary | ICD-10-CM | POA: Diagnosis not present

## 2015-12-24 DIAGNOSIS — D122 Benign neoplasm of ascending colon: Secondary | ICD-10-CM | POA: Diagnosis not present

## 2015-12-24 DIAGNOSIS — K635 Polyp of colon: Secondary | ICD-10-CM | POA: Diagnosis not present

## 2016-01-04 DIAGNOSIS — Z23 Encounter for immunization: Secondary | ICD-10-CM | POA: Diagnosis not present

## 2016-02-25 DIAGNOSIS — K119 Disease of salivary gland, unspecified: Secondary | ICD-10-CM | POA: Diagnosis not present

## 2016-02-25 DIAGNOSIS — J342 Deviated nasal septum: Secondary | ICD-10-CM | POA: Diagnosis not present

## 2016-03-18 DIAGNOSIS — Z1389 Encounter for screening for other disorder: Secondary | ICD-10-CM | POA: Diagnosis not present

## 2016-03-18 DIAGNOSIS — Z1231 Encounter for screening mammogram for malignant neoplasm of breast: Secondary | ICD-10-CM | POA: Diagnosis not present

## 2016-03-18 DIAGNOSIS — Z0000 Encounter for general adult medical examination without abnormal findings: Secondary | ICD-10-CM | POA: Diagnosis not present

## 2016-03-18 DIAGNOSIS — E663 Overweight: Secondary | ICD-10-CM | POA: Diagnosis not present

## 2016-03-28 DIAGNOSIS — Z87898 Personal history of other specified conditions: Secondary | ICD-10-CM | POA: Diagnosis not present

## 2016-03-28 DIAGNOSIS — N182 Chronic kidney disease, stage 2 (mild): Secondary | ICD-10-CM | POA: Diagnosis not present

## 2016-03-28 DIAGNOSIS — K219 Gastro-esophageal reflux disease without esophagitis: Secondary | ICD-10-CM | POA: Diagnosis not present

## 2016-03-28 DIAGNOSIS — E1122 Type 2 diabetes mellitus with diabetic chronic kidney disease: Secondary | ICD-10-CM | POA: Diagnosis not present

## 2016-03-28 DIAGNOSIS — M79672 Pain in left foot: Secondary | ICD-10-CM | POA: Diagnosis not present

## 2016-04-17 DIAGNOSIS — M7752 Other enthesopathy of left foot: Secondary | ICD-10-CM

## 2016-04-17 DIAGNOSIS — M79672 Pain in left foot: Secondary | ICD-10-CM | POA: Diagnosis not present

## 2016-04-17 DIAGNOSIS — M6702 Short Achilles tendon (acquired), left ankle: Secondary | ICD-10-CM | POA: Diagnosis not present

## 2016-04-17 DIAGNOSIS — G8929 Other chronic pain: Secondary | ICD-10-CM

## 2016-04-17 HISTORY — DX: Other chronic pain: G89.29

## 2016-04-17 HISTORY — DX: Other enthesopathy of left foot and ankle: M77.52

## 2016-04-20 DIAGNOSIS — M6702 Short Achilles tendon (acquired), left ankle: Secondary | ICD-10-CM

## 2016-04-20 HISTORY — DX: Short Achilles tendon (acquired), left ankle: M67.02

## 2016-05-06 DIAGNOSIS — M7752 Other enthesopathy of left foot: Secondary | ICD-10-CM | POA: Diagnosis not present

## 2016-05-06 DIAGNOSIS — M6702 Short Achilles tendon (acquired), left ankle: Secondary | ICD-10-CM | POA: Diagnosis not present

## 2016-05-09 DIAGNOSIS — J101 Influenza due to other identified influenza virus with other respiratory manifestations: Secondary | ICD-10-CM | POA: Diagnosis not present

## 2016-05-09 DIAGNOSIS — J22 Unspecified acute lower respiratory infection: Secondary | ICD-10-CM | POA: Diagnosis not present

## 2016-05-12 DIAGNOSIS — J101 Influenza due to other identified influenza virus with other respiratory manifestations: Secondary | ICD-10-CM | POA: Diagnosis not present

## 2016-05-13 DIAGNOSIS — R0602 Shortness of breath: Secondary | ICD-10-CM | POA: Diagnosis not present

## 2016-05-13 DIAGNOSIS — J101 Influenza due to other identified influenza virus with other respiratory manifestations: Secondary | ICD-10-CM | POA: Diagnosis not present

## 2016-05-23 DIAGNOSIS — R8290 Unspecified abnormal findings in urine: Secondary | ICD-10-CM | POA: Diagnosis not present

## 2016-05-23 DIAGNOSIS — J22 Unspecified acute lower respiratory infection: Secondary | ICD-10-CM | POA: Diagnosis not present

## 2016-05-29 DIAGNOSIS — M7752 Other enthesopathy of left foot: Secondary | ICD-10-CM | POA: Diagnosis not present

## 2016-05-29 DIAGNOSIS — M6702 Short Achilles tendon (acquired), left ankle: Secondary | ICD-10-CM | POA: Diagnosis not present

## 2016-05-31 DIAGNOSIS — R0602 Shortness of breath: Secondary | ICD-10-CM | POA: Diagnosis not present

## 2016-05-31 DIAGNOSIS — F411 Generalized anxiety disorder: Secondary | ICD-10-CM | POA: Diagnosis not present

## 2016-06-04 DIAGNOSIS — I129 Hypertensive chronic kidney disease with stage 1 through stage 4 chronic kidney disease, or unspecified chronic kidney disease: Secondary | ICD-10-CM | POA: Diagnosis not present

## 2016-06-04 DIAGNOSIS — N182 Chronic kidney disease, stage 2 (mild): Secondary | ICD-10-CM | POA: Diagnosis not present

## 2016-06-04 DIAGNOSIS — F410 Panic disorder [episodic paroxysmal anxiety] without agoraphobia: Secondary | ICD-10-CM | POA: Diagnosis not present

## 2016-06-18 DIAGNOSIS — J101 Influenza due to other identified influenza virus with other respiratory manifestations: Secondary | ICD-10-CM | POA: Diagnosis not present

## 2016-06-18 DIAGNOSIS — R8290 Unspecified abnormal findings in urine: Secondary | ICD-10-CM | POA: Diagnosis not present

## 2016-06-18 DIAGNOSIS — J22 Unspecified acute lower respiratory infection: Secondary | ICD-10-CM | POA: Diagnosis not present

## 2016-06-18 DIAGNOSIS — R7303 Prediabetes: Secondary | ICD-10-CM | POA: Diagnosis not present

## 2016-06-18 DIAGNOSIS — R0789 Other chest pain: Secondary | ICD-10-CM | POA: Diagnosis not present

## 2016-06-18 DIAGNOSIS — H524 Presbyopia: Secondary | ICD-10-CM | POA: Diagnosis not present

## 2016-06-23 DIAGNOSIS — E1122 Type 2 diabetes mellitus with diabetic chronic kidney disease: Secondary | ICD-10-CM | POA: Diagnosis not present

## 2016-06-23 DIAGNOSIS — N182 Chronic kidney disease, stage 2 (mild): Secondary | ICD-10-CM | POA: Diagnosis not present

## 2016-06-23 DIAGNOSIS — J45909 Unspecified asthma, uncomplicated: Secondary | ICD-10-CM | POA: Diagnosis not present

## 2016-06-23 DIAGNOSIS — I129 Hypertensive chronic kidney disease with stage 1 through stage 4 chronic kidney disease, or unspecified chronic kidney disease: Secondary | ICD-10-CM | POA: Diagnosis not present

## 2016-07-02 DIAGNOSIS — K119 Disease of salivary gland, unspecified: Secondary | ICD-10-CM | POA: Diagnosis not present

## 2016-07-02 DIAGNOSIS — J45909 Unspecified asthma, uncomplicated: Secondary | ICD-10-CM | POA: Diagnosis not present

## 2016-07-02 DIAGNOSIS — N182 Chronic kidney disease, stage 2 (mild): Secondary | ICD-10-CM | POA: Diagnosis not present

## 2016-07-02 DIAGNOSIS — I129 Hypertensive chronic kidney disease with stage 1 through stage 4 chronic kidney disease, or unspecified chronic kidney disease: Secondary | ICD-10-CM | POA: Diagnosis not present

## 2016-07-02 DIAGNOSIS — N390 Urinary tract infection, site not specified: Secondary | ICD-10-CM | POA: Diagnosis not present

## 2016-07-17 DIAGNOSIS — R221 Localized swelling, mass and lump, neck: Secondary | ICD-10-CM | POA: Diagnosis not present

## 2016-07-17 DIAGNOSIS — D37030 Neoplasm of uncertain behavior of the parotid salivary glands: Secondary | ICD-10-CM | POA: Diagnosis not present

## 2016-07-17 DIAGNOSIS — K119 Disease of salivary gland, unspecified: Secondary | ICD-10-CM | POA: Diagnosis not present

## 2016-07-30 DIAGNOSIS — K118 Other diseases of salivary glands: Secondary | ICD-10-CM | POA: Diagnosis not present

## 2016-07-30 DIAGNOSIS — D37030 Neoplasm of uncertain behavior of the parotid salivary glands: Secondary | ICD-10-CM | POA: Diagnosis not present

## 2016-08-15 DIAGNOSIS — R600 Localized edema: Secondary | ICD-10-CM | POA: Diagnosis not present

## 2016-08-15 DIAGNOSIS — M79661 Pain in right lower leg: Secondary | ICD-10-CM | POA: Diagnosis not present

## 2016-08-15 DIAGNOSIS — R252 Cramp and spasm: Secondary | ICD-10-CM | POA: Diagnosis not present

## 2016-09-26 DIAGNOSIS — I129 Hypertensive chronic kidney disease with stage 1 through stage 4 chronic kidney disease, or unspecified chronic kidney disease: Secondary | ICD-10-CM | POA: Diagnosis not present

## 2016-09-26 DIAGNOSIS — E1122 Type 2 diabetes mellitus with diabetic chronic kidney disease: Secondary | ICD-10-CM | POA: Diagnosis not present

## 2016-09-26 DIAGNOSIS — Z79899 Other long term (current) drug therapy: Secondary | ICD-10-CM | POA: Diagnosis not present

## 2016-09-26 DIAGNOSIS — E782 Mixed hyperlipidemia: Secondary | ICD-10-CM | POA: Diagnosis not present

## 2016-09-26 DIAGNOSIS — M109 Gout, unspecified: Secondary | ICD-10-CM | POA: Diagnosis not present

## 2016-09-26 DIAGNOSIS — K219 Gastro-esophageal reflux disease without esophagitis: Secondary | ICD-10-CM | POA: Diagnosis not present

## 2016-09-26 DIAGNOSIS — N182 Chronic kidney disease, stage 2 (mild): Secondary | ICD-10-CM | POA: Diagnosis not present

## 2016-11-10 DIAGNOSIS — R1084 Generalized abdominal pain: Secondary | ICD-10-CM | POA: Diagnosis not present

## 2016-11-10 DIAGNOSIS — R102 Pelvic and perineal pain: Secondary | ICD-10-CM | POA: Diagnosis not present

## 2016-11-12 DIAGNOSIS — K5732 Diverticulitis of large intestine without perforation or abscess without bleeding: Secondary | ICD-10-CM | POA: Diagnosis not present

## 2016-11-18 DIAGNOSIS — K5900 Constipation, unspecified: Secondary | ICD-10-CM | POA: Diagnosis not present

## 2016-11-18 DIAGNOSIS — K5732 Diverticulitis of large intestine without perforation or abscess without bleeding: Secondary | ICD-10-CM | POA: Diagnosis not present

## 2016-12-24 ENCOUNTER — Telehealth: Payer: Self-pay | Admitting: Cardiology

## 2016-12-24 NOTE — Telephone Encounter (Signed)
Spoke with pt, Follow up scheduled  

## 2016-12-24 NOTE — Telephone Encounter (Signed)
New message    Pt is calling. She said she spoke to a nurse this morning and wants to come in today if possible.

## 2016-12-24 NOTE — Telephone Encounter (Signed)
S/w pt she states that she has had some BP issues and some intermittent chest pain on on Saturday 9-22. She states that she thought that this was just indigestion so she took 2 asa and Tums and rested and this subsided.  She states that she had no sx of chest pain on Sunday or Monday. She states that she has family sx of cardiac issues, her bother died this year in 10-08-22 of CHF. Pt states that at work yesterday she felt lightheaded and jittery, denies any other sx chest pain or pressure, SOB, etc... She states that when she got home from work she was doing some chores and did not feel well, she states that her heart started pounding in her chest so she sat down and took her BP 165/96 HR 99. She rested and her sx stopped.  Went to bed and woke up a little later and her BP was 188/97 HR 109. She went back to bed. And she woke up this morning and she is fine today and denies any sx. Her BP 138/83 HR 75. She states that she is taking her medications as ordered pravastatin, verapamil 240 at bedtime. She will continue to take her BP and HR, she will call back if she has any sx.  Tried to schedule appt with PA she declines and states that she would rather see Dr Jens Som appt scheduled 01-22-17 @ 840am. Tried to schedule sooner with PA and once again she declines and states that she would like to wait until appt with Dr Jens Som. She will call back if BP goes up or any sxs develop.

## 2016-12-24 NOTE — Telephone Encounter (Signed)
New message   Pt c/o BP issue: STAT if pt c/o blurred vision, one-sided weakness or slurred speech  1. What are your last 5 BP readings? 138/83 pulse 75 this morning. Last night 165/96 pulse 99, 164/99 pulse 99, 188/97 pulse 109  2. Are you having any other symptoms (ex. Dizziness, headache, blurred vision, passed out)? Headache, some dizziness 3. What is your BP issue? Per pt feels bp too high

## 2016-12-25 ENCOUNTER — Ambulatory Visit (INDEPENDENT_AMBULATORY_CARE_PROVIDER_SITE_OTHER): Payer: BLUE CROSS/BLUE SHIELD | Admitting: Cardiology

## 2016-12-25 ENCOUNTER — Encounter: Payer: Self-pay | Admitting: Cardiology

## 2016-12-25 VITALS — BP 140/92 | HR 89 | Ht 63.5 in | Wt 170.0 lb

## 2016-12-25 DIAGNOSIS — R072 Precordial pain: Secondary | ICD-10-CM

## 2016-12-25 DIAGNOSIS — I10 Essential (primary) hypertension: Secondary | ICD-10-CM

## 2016-12-25 DIAGNOSIS — R002 Palpitations: Secondary | ICD-10-CM

## 2016-12-25 MED ORDER — VERAPAMIL HCL ER 360 MG PO CP24: 360.0000 mg | ORAL_CAPSULE | Freq: Every day | ORAL | 3 refills | Status: DC

## 2016-12-25 NOTE — Patient Instructions (Signed)
Medication Instructions:   INCREASE VERAPAMIL TO 360 MG ONCE DAILY   Testing/Procedures:  Your physician has requested that you have an exercise tolerance test. For further information please visit [email or web address] Please also follow instruction sheet, as given.    Follow-Up:  Your physician wants you to follow-up in: 6 MONTHS WITH DR Jens Som You will receive a reminder letter in the mail two months in advance. If you don't receive a letter, please call our office to schedule the follow-up appointment.   If you need a refill on your cardiac medications before your next appointment, please call your pharmacy.

## 2016-12-25 NOTE — Progress Notes (Signed)
HPI: FU near syncope and SVT. Monitor March 2017 showed sinus rhythm. Echocardiogram March 2017 showed normal LV systolic function, grade 1 diastolic dysfunction. Exercise treadmill April 2017 negative. Since last seen, patient recently has had chest pain 5 days ago. He was described as an intermittent squeezing sensation for 5 hours. Not pleuritic or positional. No radiation. No associated symptoms. She has low-grade chest pain continuously by her report. She notes some dyspnea at times that she attributes to asthma. She also has had bouts of palpitations described as her heart pounding. This can last for 2 hours at a time. She feels weak afterwards.  Current Outpatient Prescriptions  Medication Sig Dispense Refill  . ALPRAZolam (XANAX) 0.25 MG tablet Take 0.25 mg by mouth at bedtime as needed for anxiety.    . Cholecalciferol (VITAMIN D) 2000 units tablet Take 2,000 Units by mouth daily.    Marland Kitchen levalbuterol (XOPENEX HFA) 45 MCG/ACT inhaler Inhale 2 puffs into the lungs every 4 (four) hours as needed for wheezing.    . mometasone-formoterol (DULERA) 200-5 MCG/ACT AERO Inhale 2 puffs into the lungs 2 (two) times daily.    . ranitidine (ZANTAC) 150 MG capsule Take 150 mg by mouth as needed.     . verapamil (VERELAN PM) 240 MG 24 hr capsule Take 240 mg by mouth at bedtime.     No current facility-administered medications for this visit.      Past Medical History:  Diagnosis Date  . Asthma   . Diabetes mellitus without complication (HCC)   . GERD (gastroesophageal reflux disease)   . Hyperlipidemia   . Hypertension   . Renal insufficiency   . SVT (supraventricular tachycardia) (HCC)     Past Surgical History:  Procedure Laterality Date  . ABDOMINAL HYSTERECTOMY    . APPENDECTOMY    . CHOLECYSTECTOMY    . TONSILLECTOMY      Social History   Social History  . Marital status: Married    Spouse name: Rayna Sexton  . Number of children: 2  . Years of education: 12+   Occupational  History  . [employer] services    Social History Main Topics  . Smoking status: Never Smoker  . Smokeless tobacco: Never Used  . Alcohol use No  . Drug use: No  . Sexual activity: Not on file   Other Topics Concern  . Not on file   Social History Narrative   Lives with spouse   Caffeine use:  1-2 coke zero per day     Family History  Problem Relation Age of Onset  . Diabetes Mother   . Stroke Mother   . Hypertension Mother   . Alzheimer's disease Mother   . Heart failure Mother   . Heart failure Father   . Heart disease Father   . Diabetes Father   . Hypertension Father   . Heart failure Brother   . Hypertension Brother   . Heart disease Brother   . Neuropathy Neg Hx   . Multiple sclerosis Neg Hx   . Migraines Neg Hx   . Ataxia Neg Hx     ROS: Fatigue and general malaise but no fevers or chills, productive cough, hemoptysis, dysphasia, odynophagia, melena, hematochezia, dysuria, hematuria, rash, seizure activity, orthopnea, PND, pedal edema, claudication. Remaining systems are negative.  Physical Exam: Well-developed well-nourished in no acute distress.  Skin is warm and dry.  HEENT is normal.  Neck is supple.  Chest is clear to auscultation  with normal expansion.  Cardiovascular exam is regular rate and rhythm.  Abdominal exam nontender or distended. No masses palpated. Extremities show no edema. neuro grossly intact  ECG- Normal sinus rhythm at a rate of 89. No ST changes. personally reviewed  A/P  1 hypertension-blood pressure is mildly elevated. Increase verapamil to 360 mg daily and follow.  2 near syncope-no recent episodes.  3 palpitations-patient continues to have palpitations. I have recommended Alivecor to further assess. If she is having frequent episodes of SVT we could refer for ablation.  4 history of supraventricular tachycardia-given worsening symptoms of palpitations, will increase verapamil. As above I have recommended alivecor to see  if her palpitations are secondary to SVT.  5 chest pain-symptoms are atypical. Plan exercise treadmill for risk stratification.    Olga Millers, MD

## 2016-12-29 ENCOUNTER — Other Ambulatory Visit: Payer: Self-pay | Admitting: Cardiology

## 2016-12-29 DIAGNOSIS — R072 Precordial pain: Secondary | ICD-10-CM

## 2016-12-30 ENCOUNTER — Other Ambulatory Visit: Payer: Self-pay | Admitting: Cardiology

## 2016-12-30 DIAGNOSIS — R072 Precordial pain: Secondary | ICD-10-CM

## 2016-12-31 ENCOUNTER — Other Ambulatory Visit: Payer: Self-pay | Admitting: Cardiology

## 2016-12-31 DIAGNOSIS — R8290 Unspecified abnormal findings in urine: Secondary | ICD-10-CM | POA: Diagnosis not present

## 2016-12-31 DIAGNOSIS — R072 Precordial pain: Secondary | ICD-10-CM

## 2016-12-31 DIAGNOSIS — N182 Chronic kidney disease, stage 2 (mild): Secondary | ICD-10-CM | POA: Diagnosis not present

## 2016-12-31 DIAGNOSIS — E1122 Type 2 diabetes mellitus with diabetic chronic kidney disease: Secondary | ICD-10-CM | POA: Diagnosis not present

## 2016-12-31 DIAGNOSIS — E782 Mixed hyperlipidemia: Secondary | ICD-10-CM | POA: Diagnosis not present

## 2016-12-31 DIAGNOSIS — I129 Hypertensive chronic kidney disease with stage 1 through stage 4 chronic kidney disease, or unspecified chronic kidney disease: Secondary | ICD-10-CM | POA: Diagnosis not present

## 2016-12-31 DIAGNOSIS — Z23 Encounter for immunization: Secondary | ICD-10-CM | POA: Diagnosis not present

## 2016-12-31 DIAGNOSIS — K219 Gastro-esophageal reflux disease without esophagitis: Secondary | ICD-10-CM | POA: Diagnosis not present

## 2017-01-08 ENCOUNTER — Telehealth (HOSPITAL_COMMUNITY): Payer: Self-pay

## 2017-01-08 NOTE — Telephone Encounter (Signed)
Encounter complete. 

## 2017-01-13 ENCOUNTER — Ambulatory Visit (HOSPITAL_COMMUNITY)
Admission: RE | Admit: 2017-01-13 | Discharge: 2017-01-13 | Disposition: A | Discharge status: Home / Self Care | Payer: BLUE CROSS/BLUE SHIELD | Source: Ambulatory Visit | Attending: Cardiology | Admitting: Cardiology

## 2017-01-13 DIAGNOSIS — R072 Precordial pain: Secondary | ICD-10-CM | POA: Diagnosis not present

## 2017-01-13 LAB — EXERCISE TOLERANCE TEST
CHL CUP MPHR: 158 {beats}/min
CHL CUP RESTING HR STRESS: 77 {beats}/min
CHL RATE OF PERCEIVED EXERTION: 18
CSEPEDS: 28 s
Estimated workload: 7 METS
Exercise duration (min): 5 min
Peak HR: 144 {beats}/min
Percent HR: 91 %

## 2017-01-14 DIAGNOSIS — Z23 Encounter for immunization: Secondary | ICD-10-CM | POA: Diagnosis not present

## 2017-01-16 DIAGNOSIS — R0789 Other chest pain: Secondary | ICD-10-CM | POA: Diagnosis not present

## 2017-01-22 ENCOUNTER — Ambulatory Visit: Payer: BLUE CROSS/BLUE SHIELD | Admitting: Cardiology

## 2017-01-22 DIAGNOSIS — R1310 Dysphagia, unspecified: Secondary | ICD-10-CM | POA: Diagnosis not present

## 2017-01-22 DIAGNOSIS — R079 Chest pain, unspecified: Secondary | ICD-10-CM | POA: Diagnosis not present

## 2017-01-22 DIAGNOSIS — R12 Heartburn: Secondary | ICD-10-CM | POA: Diagnosis not present

## 2017-01-30 DIAGNOSIS — R0789 Other chest pain: Secondary | ICD-10-CM | POA: Diagnosis not present

## 2017-01-30 DIAGNOSIS — K219 Gastro-esophageal reflux disease without esophagitis: Secondary | ICD-10-CM | POA: Diagnosis not present

## 2017-01-30 DIAGNOSIS — K2900 Acute gastritis without bleeding: Secondary | ICD-10-CM | POA: Diagnosis not present

## 2017-01-30 DIAGNOSIS — R1314 Dysphagia, pharyngoesophageal phase: Secondary | ICD-10-CM | POA: Diagnosis not present

## 2017-01-30 DIAGNOSIS — K2970 Gastritis, unspecified, without bleeding: Secondary | ICD-10-CM | POA: Diagnosis not present

## 2017-01-30 DIAGNOSIS — K228 Other specified diseases of esophagus: Secondary | ICD-10-CM | POA: Diagnosis not present

## 2017-01-30 DIAGNOSIS — R1310 Dysphagia, unspecified: Secondary | ICD-10-CM | POA: Diagnosis not present

## 2017-01-30 DIAGNOSIS — K2950 Unspecified chronic gastritis without bleeding: Secondary | ICD-10-CM | POA: Diagnosis not present

## 2017-03-05 DIAGNOSIS — K219 Gastro-esophageal reflux disease without esophagitis: Secondary | ICD-10-CM | POA: Diagnosis not present

## 2017-03-05 DIAGNOSIS — J45909 Unspecified asthma, uncomplicated: Secondary | ICD-10-CM | POA: Diagnosis not present

## 2017-03-05 DIAGNOSIS — K200 Eosinophilic esophagitis: Secondary | ICD-10-CM | POA: Diagnosis not present

## 2017-04-06 DIAGNOSIS — Z0000 Encounter for general adult medical examination without abnormal findings: Secondary | ICD-10-CM | POA: Diagnosis not present

## 2017-04-06 DIAGNOSIS — Z1231 Encounter for screening mammogram for malignant neoplasm of breast: Secondary | ICD-10-CM | POA: Diagnosis not present

## 2017-04-06 DIAGNOSIS — Z1331 Encounter for screening for depression: Secondary | ICD-10-CM | POA: Diagnosis not present

## 2017-04-06 DIAGNOSIS — Z1211 Encounter for screening for malignant neoplasm of colon: Secondary | ICD-10-CM | POA: Diagnosis not present

## 2017-04-06 DIAGNOSIS — E1122 Type 2 diabetes mellitus with diabetic chronic kidney disease: Secondary | ICD-10-CM | POA: Diagnosis not present

## 2017-04-06 DIAGNOSIS — N182 Chronic kidney disease, stage 2 (mild): Secondary | ICD-10-CM | POA: Diagnosis not present

## 2017-04-07 DIAGNOSIS — K219 Gastro-esophageal reflux disease without esophagitis: Secondary | ICD-10-CM | POA: Diagnosis not present

## 2017-04-07 DIAGNOSIS — K200 Eosinophilic esophagitis: Secondary | ICD-10-CM | POA: Diagnosis not present

## 2017-04-14 DIAGNOSIS — L300 Nummular dermatitis: Secondary | ICD-10-CM | POA: Diagnosis not present

## 2017-04-14 DIAGNOSIS — D1801 Hemangioma of skin and subcutaneous tissue: Secondary | ICD-10-CM | POA: Diagnosis not present

## 2017-04-14 DIAGNOSIS — L821 Other seborrheic keratosis: Secondary | ICD-10-CM | POA: Diagnosis not present

## 2017-04-21 DIAGNOSIS — Z1231 Encounter for screening mammogram for malignant neoplasm of breast: Secondary | ICD-10-CM | POA: Diagnosis not present

## 2017-04-29 DIAGNOSIS — J45909 Unspecified asthma, uncomplicated: Secondary | ICD-10-CM | POA: Diagnosis not present

## 2017-04-29 DIAGNOSIS — K219 Gastro-esophageal reflux disease without esophagitis: Secondary | ICD-10-CM | POA: Diagnosis not present

## 2017-04-29 DIAGNOSIS — N182 Chronic kidney disease, stage 2 (mild): Secondary | ICD-10-CM | POA: Diagnosis not present

## 2017-04-29 DIAGNOSIS — E1122 Type 2 diabetes mellitus with diabetic chronic kidney disease: Secondary | ICD-10-CM | POA: Diagnosis not present

## 2017-04-29 DIAGNOSIS — I129 Hypertensive chronic kidney disease with stage 1 through stage 4 chronic kidney disease, or unspecified chronic kidney disease: Secondary | ICD-10-CM | POA: Diagnosis not present

## 2017-07-21 DIAGNOSIS — R1011 Right upper quadrant pain: Secondary | ICD-10-CM | POA: Diagnosis not present

## 2017-08-11 DIAGNOSIS — J209 Acute bronchitis, unspecified: Secondary | ICD-10-CM | POA: Diagnosis not present

## 2017-08-19 DIAGNOSIS — K200 Eosinophilic esophagitis: Secondary | ICD-10-CM | POA: Diagnosis not present

## 2017-08-19 DIAGNOSIS — I10 Essential (primary) hypertension: Secondary | ICD-10-CM | POA: Diagnosis not present

## 2017-08-19 DIAGNOSIS — J4520 Mild intermittent asthma, uncomplicated: Secondary | ICD-10-CM | POA: Diagnosis not present

## 2017-08-19 DIAGNOSIS — E785 Hyperlipidemia, unspecified: Secondary | ICD-10-CM | POA: Diagnosis not present

## 2017-09-07 DIAGNOSIS — I10 Essential (primary) hypertension: Secondary | ICD-10-CM | POA: Diagnosis not present

## 2017-09-07 DIAGNOSIS — E785 Hyperlipidemia, unspecified: Secondary | ICD-10-CM | POA: Diagnosis not present

## 2017-09-07 DIAGNOSIS — Z6827 Body mass index (BMI) 27.0-27.9, adult: Secondary | ICD-10-CM | POA: Diagnosis not present

## 2017-09-24 ENCOUNTER — Encounter

## 2017-09-24 ENCOUNTER — Ambulatory Visit: Payer: BLUE CROSS/BLUE SHIELD | Admitting: Allergy and Immunology

## 2017-09-24 ENCOUNTER — Encounter: Payer: Self-pay | Admitting: Allergy and Immunology

## 2017-09-24 VITALS — BP 158/88 | HR 76 | Resp 18 | Ht 64.0 in | Wt 165.8 lb

## 2017-09-24 DIAGNOSIS — K200 Eosinophilic esophagitis: Secondary | ICD-10-CM | POA: Diagnosis not present

## 2017-09-24 DIAGNOSIS — J4530 Mild persistent asthma, uncomplicated: Secondary | ICD-10-CM | POA: Diagnosis not present

## 2017-09-24 DIAGNOSIS — J3089 Other allergic rhinitis: Secondary | ICD-10-CM

## 2017-09-24 DIAGNOSIS — K219 Gastro-esophageal reflux disease without esophagitis: Secondary | ICD-10-CM

## 2017-09-24 MED ORDER — BUDESONIDE 180 MCG/ACT IN AEPB: 2.0000 | INHALATION_SPRAY | Freq: Two times a day (BID) | RESPIRATORY_TRACT | 3 refills | Status: DC

## 2017-09-24 NOTE — Patient Instructions (Addendum)
  1.  Allergen avoidance measures  2.  Treat and prevent inflammation with the following:   A.  Pulmicort 180 -2 inhalations twice a day  B.  OTC Nasacort -1 spray each nostril once a day  3.  Treat and prevent reflux:   A.  Taper off all forms of caffeine consumption  B.  Continue ranitidine 300 mg twice a day  4.  If needed:   A.  Xopenex HFA 2 puffs every 4-6 hours  B.  OTC antihistamine -Claritin/Zyrtec-1 time per day  5.  Recognize difference between throat problems and lung problems  6.  Review records from esophageal biopsy  7.  Return to clinic in 4 weeks or earlier if problem

## 2017-09-24 NOTE — Progress Notes (Signed)
Dear Dr. Maisie Fus,  Thank you for referring Sandi Mealy to the [facility] on 09/24/2017.   Below is a summation of this patient's evaluation and recommendations.  Thank you for your referral. I will keep you informed about this patient's response to treatment.   If you have any questions please do not hesitate to contact me.   Sincerely,  Jessica Priest, MD Allergy / Immunology [facility]   ______________________________________________________________________    NEW PATIENT NOTE  Referring Provider: Doreene Eland, MD Primary Provider: Patient, No Pcp Per Date of office visit: 09/24/2017    Subjective:   Chief Complaint:  Dawn Perkins (DOB: 1956-03-26) is a 62 y.o. female who presents to the clinic on 09/24/2017 with a chief complaint of Asthma (coughing, wheezing, shortness of breath); eosinophilic esophagitis; Sinus Problem (PND, blowing nose); Dry Eye; and Urticaria .     HPI: Verma presents to this clinic in evaluation of several distinct issues.  Apparently I had seen her in this clinic somewhere between 5 to 10 years ago for evaluation of her urticaria which turned out to be a sensitivity to proton pump inhibitors.  Lizbet states that she has been diagnosed with eosinophilic esophagitis by Dr. Charm Barges in September 2018 for an issue associated with chest pain and swallowing obstruction.  She has been given ranitidine 300 mg twice a day and her chest pain frequency is now about 1 time per month as long as she eats quite plainly.  If she eats acidic food she develops problems with chest pain.  She also continues to consume about 20 ounces of Coke per day and occasionally has tea but no chocolate.  It does not sound as though she was given any swallowed steroids to treat her eosinophilic esophagitis.  Lena also states that she has a long history of asthma.  Her  asthma is manifested as chest pressure and some cough and shortness of breath.  When she has a very bad attack of asthma she gets throat clearing and her voice changes and occasionally she feels a little unsteady.  She states that she gets "bronchitis" 2 times per year and this is usually the same symptoms as her asthma except with fever.  She has been given Dulera in the past but she does not use this medication because it makes her asthma "worse".  She will use Xopenex whenever she develops her problems and she will use it about every 4 hours for 2 days or so about every 10 days or so.  This appears to be a little bit more prevalent since the spring time season has arrived.  Triggers include exposure to exercise and exposure to strong smells and getting in a hot environment.  She also has some nasal congestion and sneezing but this is not a particularly big issue for her.  Past Medical History:  Diagnosis Date  . Asthma   . Diabetes mellitus without complication (HCC)   . GERD (gastroesophageal reflux disease)   . Hyperlipidemia   . Hypertension   . Renal insufficiency   . SVT (supraventricular tachycardia) (HCC)     Past Surgical History:  Procedure Laterality Date  . ABDOMINAL HYSTERECTOMY    . APPENDECTOMY    . CHOLECYSTECTOMY    . TONSILLECTOMY      Allergies as of 09/24/2017      Reactions   Effexor Xr [venlafaxine Hcl]  Telmisartan Shortness Of Breath, Rash   Esomeprazole    Rash   Esomeprazole Magnesium    Rash   Omeprazole    rash   Omeprazole Magnesium    rash   Other    Tape    Venlafaxine    Trouble breathing   Nexium I.v. [esomeprazole Sodium] Rash      Medication List      ALPRAZolam 0.25 MG tablet Commonly known as:  XANAX Take 0.25 mg by mouth at bedtime as needed for anxiety.   DULERA 200-5 MCG/ACT Aero Generic drug:  mometasone-formoterol Inhale 2 puffs into the lungs 2 (two) times daily.   levalbuterol 45 MCG/ACT inhaler Commonly known as:   XOPENEX HFA Inhale 2 puffs into the lungs every 4 (four) hours as needed for wheezing.   ranitidine 300 MG tablet Commonly known as:  ZANTAC Take 300 mg by mouth 2 (two) times daily.   verapamil 240 MG CR tablet Commonly known as:  CALAN-SR Take 240 mg by mouth at bedtime.   Vitamin D 2000 units tablet Take 2,000 Units by mouth daily.       Review of systems negative except as noted in HPI / PMHx or noted below:  Review of Systems  Constitutional: Negative.   HENT: Negative.   Eyes: Negative.   Respiratory: Negative.   Cardiovascular: Negative.   Gastrointestinal: Negative.   Genitourinary: Negative.   Musculoskeletal: Negative.   Skin: Negative.   Neurological: Negative.   Endo/Heme/Allergies: Negative.   Psychiatric/Behavioral: Negative.     Family History  Problem Relation Age of Onset  . Diabetes Mother   . Stroke Mother   . Hypertension Mother   . Alzheimer's disease Mother   . Heart failure Mother   . Heart failure Father   . Heart disease Father   . Diabetes Father   . Hypertension Father   . Heart failure Brother   . Hypertension Brother   . Heart disease Brother   . Diabetes Brother   . Heart disease Brother   . Diabetes Brother   . Asthma Brother   . Neuropathy Neg Hx   . Multiple sclerosis Neg Hx   . Migraines Neg Hx   . Ataxia Neg Hx     Social History   Socioeconomic History  . Marital status: Married    Spouse name: Rayna Sexton  . Number of children: 2  . Years of education: 12+  . Highest education level: Not on file  Occupational History  . Occupation: [employer]  Social Needs  . Financial resource strain: Not on file  . Food insecurity:    Worry: Not on file    Inability: Not on file  . Transportation needs:    Medical: Not on file    Non-medical: Not on file  Tobacco Use  . Smoking status: Never Smoker  . Smokeless tobacco: Never Used  Substance and Sexual Activity  . Alcohol use: No  . Drug use: No  . Sexual  activity: Not on file  Lifestyle  . Physical activity:    Days per week: Not on file    Minutes per session: Not on file  . Stress: Not on file  Relationships  . Social connections:    Talks on phone: Not on file    Gets together: Not on file    Attends religious service: Not on file    Active member of club or organization: Not on file    Attends meetings of  clubs or organizations: Not on file    Relationship status: Not on file  . Intimate partner violence:    Fear of current or ex partner: Not on file    Emotionally abused: Not on file    Physically abused: Not on file    Forced sexual activity: Not on file  Other Topics Concern  . Not on file  Social History Narrative   Lives with spouse   Caffeine use:  1-2 coke zero per day     Environmental and Social history  [name] lives in a house with a dry environment, a dog located inside the household, carpet in the bedroom, no plastic on the bed, no plastic on the pillow, and no smokers located inside the household.  She works in an office setting.  Objective:   Vitals:   09/24/17 1503  BP: (!) 158/88  Pulse: 76  Resp: 18   Height: 5\' 4"  (162.6 cm) Weight: 165 lb 12.8 oz (75.2 kg)  Physical Exam  HENT:  Head: Normocephalic. Head is without right periorbital erythema and without left periorbital erythema.  Right Ear: Tympanic membrane, external ear and ear canal normal.  Left Ear: Tympanic membrane, external ear and ear canal normal.  Nose: Nose normal. No mucosal edema or rhinorrhea.  Mouth/Throat: Oropharynx is clear and moist and mucous membranes are normal. No oropharyngeal exudate.  Eyes: Pupils are equal, round, and reactive to light. Conjunctivae and lids are normal.  Neck: Trachea normal. No tracheal deviation present. No thyromegaly present.  Cardiovascular: Normal rate, regular rhythm, S1 normal, S2 normal and normal heart sounds.  No murmur heard. Pulmonary/Chest: Effort normal. No stridor. No respiratory  distress. She has no wheezes. She has no rales. She exhibits no tenderness.  Abdominal: Soft. She exhibits no distension and no mass. There is no hepatosplenomegaly. There is no tenderness. There is no rebound and no guarding.  Musculoskeletal: She exhibits no edema or tenderness.  Lymphadenopathy:       Head (right side): No tonsillar adenopathy present.       Head (left side): No tonsillar adenopathy present.    She has no cervical adenopathy.    She has no axillary adenopathy.  Neurological: She is alert.  Skin: No rash noted. She is not diaphoretic. No erythema. No pallor. Nails show no clubbing.    Diagnostics: Allergy skin tests were performed.  She demonstrated hypersensitivity to dust mite and cockroach.  She did not demonstrate any hypersensitivity to foods.  Spirometry was performed and demonstrated an FEV1 of 1.39 @ 58 % of predicted. FEV1/FVC = 0.65.  Following the administration of nebulized albuterol her FEV1 rose to 1.52 which was an increase of 9%.  Assessment and Plan:    1. Not well controlled mild persistent asthma   2. Other allergic rhinitis   3. LPRD (laryngopharyngeal reflux disease)   4. Eosinophilic esophagitis     1.  Allergen avoidance measures  2.  Treat and prevent inflammation with the following:   A.  Pulmicort 180 -2 inhalations twice a day  B.  OTC Nasacort -1 spray each nostril once a day  3.  Treat and prevent reflux:   A.  Taper off all forms of caffeine consumption  B.  Continue ranitidine 300 mg twice a day  4.  If needed:   A.  Xopenex HFA 2 puffs every 4-6 hours  B.  OTC antihistamine -Claritin/Zyrtec-1 time per day  5.  Recognize difference between throat problems and lung problems  6.  Review records from esophageal biopsy  7.  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  I am going to have her perform allergen avoidance measures as best as possible and consistently use anti-inflammatory agents for both her upper and lower airway and I have made the recommendation that she taper off all caffeine to help her reflux therapy.  I will review the results of her esophageal biopsy and make a determination about whether or not she requires further evaluation and treatment for eosinophilic esophagitis.  I will regroup with her in 4 weeks or earlier if there is a problem.  Jessica Priest, MD Allergy / Immunology [facility] [place]

## 2017-09-28 ENCOUNTER — Encounter: Payer: Self-pay | Admitting: Allergy and Immunology

## 2017-10-08 ENCOUNTER — Encounter: Payer: Self-pay | Admitting: *Deleted

## 2017-10-19 ENCOUNTER — Encounter: Payer: Self-pay | Admitting: Allergy and Immunology

## 2017-10-19 ENCOUNTER — Ambulatory Visit: Payer: BLUE CROSS/BLUE SHIELD | Admitting: Allergy and Immunology

## 2017-10-19 VITALS — BP 136/80 | HR 72 | Resp 18

## 2017-10-19 DIAGNOSIS — K219 Gastro-esophageal reflux disease without esophagitis: Secondary | ICD-10-CM | POA: Diagnosis not present

## 2017-10-19 DIAGNOSIS — K200 Eosinophilic esophagitis: Secondary | ICD-10-CM

## 2017-10-19 DIAGNOSIS — J3089 Other allergic rhinitis: Secondary | ICD-10-CM | POA: Diagnosis not present

## 2017-10-19 DIAGNOSIS — J4530 Mild persistent asthma, uncomplicated: Secondary | ICD-10-CM | POA: Diagnosis not present

## 2017-10-19 MED ORDER — FLUTICASONE PROPIONATE HFA 220 MCG/ACT IN AERO: INHALATION_SPRAY | RESPIRATORY_TRACT | 5 refills | Status: DC

## 2017-10-19 NOTE — Progress Notes (Signed)
Follow-up Note  Referring Provider: No ref. provider found Primary Provider: Patient, No Pcp Per Date of Office Visit: 10/19/2017  Subjective:   Dawn Perkins (DOB: [birth year]) is a 62 y.o. female who [facility] on 10/19/2017 in re-evaluation of the following:  HPI: Dawn Perkins returns to this clinic in reevaluation of asthma, allergic rhinoconjunctivitis, eosinophilic esophagitis, and a component of LPR.  She was last seen in this clinic on 24 September 2017 at which point in time we made an attempt to address each issue.  Her airway is doing very well.  She really has no significant issues revolving around asthma or her nose at this point in time while consistently using Pulmicort and Nasacort.  She does not need to use a short acting bronchodilator.  She still has the occasional development of a lump in her throat that can last several hours and sometimes a very slow transition time through her esophagus with food.  She has decreased her amount of caffeine consumption and has continued to use ranitidine on a regular basis.  Allergies as of 10/19/2017      Reactions   Effexor Xr [venlafaxine Hcl]    Telmisartan Shortness Of Breath, Rash   Esomeprazole    Rash   Esomeprazole Magnesium    Rash   Omeprazole    rash   Omeprazole Magnesium    rash   Other    Tape    Venlafaxine    Trouble breathing   Nexium I.v. [esomeprazole Sodium] Rash      Medication List      ALPRAZolam 0.25 MG tablet Commonly known as:  XANAX Take 0.25 mg by mouth at bedtime as needed for anxiety.   budesonide 180 MCG/ACT inhaler Commonly known as:  PULMICORT Inhale 2 puffs into the lungs 2 (two) times daily. Rinse, gargle, and spit after use.   COQ-10 PO Take by mouth daily.   hydrochlorothiazide 25 MG tablet Commonly known as:  HYDRODIURIL Take 25 mg by mouth daily.   levalbuterol 45 MCG/ACT inhaler Commonly known as:  XOPENEX HFA Inhale 2 puffs into the  lungs every 4 (four) hours as needed for wheezing.   ranitidine 300 MG tablet Commonly known as:  ZANTAC Take 300 mg by mouth 2 (two) times daily.   verapamil 240 MG CR tablet Commonly known as:  CALAN-SR Take 240 mg by mouth at bedtime.   Vitamin D 2000 units tablet Take 2,000 Units by mouth daily.       Past Medical History:  Diagnosis Date  . Asthma   . Diabetes mellitus without complication (HCC)   . GERD (gastroesophageal reflux disease)   . Hyperlipidemia   . Hypertension   . Renal insufficiency   . SVT (supraventricular tachycardia) (HCC)     Past Surgical History:  Procedure Laterality Date  . ABDOMINAL HYSTERECTOMY    . APPENDECTOMY    . CHOLECYSTECTOMY    . TONSILLECTOMY      Review of systems negative except as noted in HPI / PMHx or noted below:  Review of Systems  Constitutional: Negative.   HENT: Negative.   Eyes: Negative.   Respiratory: Negative.   Cardiovascular: Negative.   Gastrointestinal: Negative.   Genitourinary: Negative.   Musculoskeletal: Negative.   Skin: Negative.   Neurological: Negative.   Endo/Heme/Allergies: Negative.   Psychiatric/Behavioral: Negative.      Objective:   Vitals:   10/19/17 1731  BP: 136/80  Pulse: 72  Resp:  18  SpO2: 98%          Physical Exam  HENT:  Head: Normocephalic.  Right Ear: Tympanic membrane, external ear and ear canal normal.  Left Ear: Tympanic membrane, external ear and ear canal normal.  Nose: Nose normal. No mucosal edema or rhinorrhea.  Mouth/Throat: Uvula is midline, oropharynx is clear and moist and mucous membranes are normal. No oropharyngeal exudate.  Eyes: Conjunctivae are normal.  Neck: Trachea normal. No tracheal tenderness present. No tracheal deviation present. No thyromegaly present.  Cardiovascular: Normal rate, regular rhythm, S1 normal, S2 normal and normal heart sounds.  No murmur heard. Pulmonary/Chest: Breath sounds normal. No stridor. No respiratory distress.  She has no wheezes. She has no rales.  Musculoskeletal: She exhibits no edema.  Lymphadenopathy:       Head (right side): No tonsillar adenopathy present.       Head (left side): No tonsillar adenopathy present.    She has no cervical adenopathy.  Neurological: She is alert.  Skin: No rash noted. She is not diaphoretic. No erythema. Nails show no clubbing.    Diagnostics:    Spirometry was performed and demonstrated an FEV1 of 1.48 at 61 % of predicted.  The patient had an Asthma Control Test with the following results: ACT Total Score: 19.    Review of an esophageal biopsy dated to November 2018 identified eosinophilic esophagitis.  Assessment and Plan:   1. Asthma, well controlled, mild persistent   2. Other allergic rhinitis   3. LPRD (laryngopharyngeal reflux disease)   4. Eosinophilic esophagitis     1.  Allergen avoidance measures  2.  Treat and prevent inflammation with the following:   A.  Flovent 220 - 2 inhalations one time per day with spacer  B.  Flovent 220 - 2 puffs and swallows one time per day without spacer  C.  OTC Nasacort -1 spray each nostril once a day  3.  Treat and prevent reflux:   A.  Continue to taper off all forms of caffeine consumption  B.  ranitidine 300 mg twice a day  4.  If needed:   A.  Xopenex HFA 2 puffs every 4-6 hours  B.  OTC antihistamine -Claritin/Zyrtec-1 time per day  5. Return to clinic in 8 weeks or earlier if problem  [name] is doing better from her airway standpoint.  We need to change her Pulmicort to Flovent because of an insurance issue.  At this point I would also like to treat her esophageal issue with swallowed Flovent.  So basically she will use 2 inhalations of Flovent 220 and 2 puffs and swallows of Flovent 220 per day.  I will regroup with her in 8 weeks and will make a determination about how to proceed at that point.  [name] SchimkeEric Kozlow, MD Allergy / Immunology [facility]

## 2017-10-19 NOTE — Patient Instructions (Signed)
  1.  Allergen avoidance measures  2.  Treat and prevent inflammation with the following:   A.  Flovent 220 - 2 inhalations one time per day with spacer  B.  Flovent 220 - 2 puffs and swallows one time per day without spacer  C.  OTC Nasacort -1 spray each nostril once a day  3.  Treat and prevent reflux:   A.  Continue to taper off all forms of caffeine consumption  B.  ranitidine 300 mg twice a day  4.  If needed:   A.  Xopenex HFA 2 puffs every 4-6 hours  B.  OTC antihistamine -Claritin/Zyrtec-1 time per day  5. Return to clinic in 8 weeks or earlier if problem

## 2017-10-20 ENCOUNTER — Encounter: Payer: Self-pay | Admitting: Allergy and Immunology

## 2017-11-10 DIAGNOSIS — I10 Essential (primary) hypertension: Secondary | ICD-10-CM | POA: Diagnosis not present

## 2017-11-10 DIAGNOSIS — I129 Hypertensive chronic kidney disease with stage 1 through stage 4 chronic kidney disease, or unspecified chronic kidney disease: Secondary | ICD-10-CM | POA: Diagnosis not present

## 2017-11-10 DIAGNOSIS — Z6826 Body mass index (BMI) 26.0-26.9, adult: Secondary | ICD-10-CM | POA: Diagnosis not present

## 2017-11-10 DIAGNOSIS — E1129 Type 2 diabetes mellitus with other diabetic kidney complication: Secondary | ICD-10-CM | POA: Diagnosis not present

## 2017-12-14 ENCOUNTER — Ambulatory Visit: Payer: BLUE CROSS/BLUE SHIELD | Admitting: Allergy and Immunology

## 2017-12-14 ENCOUNTER — Encounter: Payer: Self-pay | Admitting: Allergy and Immunology

## 2017-12-14 VITALS — BP 114/92 | HR 80 | Resp 20

## 2017-12-14 DIAGNOSIS — K200 Eosinophilic esophagitis: Secondary | ICD-10-CM

## 2017-12-14 DIAGNOSIS — J4530 Mild persistent asthma, uncomplicated: Secondary | ICD-10-CM

## 2017-12-14 DIAGNOSIS — K219 Gastro-esophageal reflux disease without esophagitis: Secondary | ICD-10-CM | POA: Diagnosis not present

## 2017-12-14 DIAGNOSIS — J3089 Other allergic rhinitis: Secondary | ICD-10-CM

## 2017-12-14 NOTE — Progress Notes (Signed)
Follow-up Note  Referring Provider: No ref. provider found Primary Provider: Patient, No Pcp Per Date of Office Visit: 12/14/2017  Subjective:   Dawn Perkins (DOB: 06/20/1955) is a 62 y.o. female who [facility] the [facility] on 12/14/2017 in re-evaluation of the following:  HPI: Dawn Hashimotoatricia returns to this clinic in reevaluation of asthma and allergic rhinoconjunctivitis and eosinophilic esophagitis and a component of LPR.  Her last visit to this clinic was 19 October 2017.  She has really done very well on her current therapy.  She has completely eliminated all caffeine consumption and has been using ranitidine on a regular basis and does not use any swallowed steroids and has had a rare slow transient time through her esophagus event.  She thinks that she has only had two of these events that were relatively mild.  She has no issues with reflux unless she eats the wrong foods.  She has had no problems with wheezing or coughing or shortness of breath and does not use a short acting bronchodilator.  She continues on Pulmicort.  Allergies as of 12/14/2017      Reactions   Effexor Xr [venlafaxine Hcl]    Telmisartan Shortness Of Breath, Rash   Esomeprazole    Rash   Esomeprazole Magnesium    Rash   Omeprazole    rash   Omeprazole Magnesium    rash   Other    Tape    Venlafaxine    Trouble breathing   Nexium I.v. [esomeprazole Sodium] Rash      Medication List      ALPRAZolam 0.25 MG tablet Commonly known as:  XANAX Take 0.25 mg by mouth at bedtime as needed for anxiety.   budesonide 180 MCG/ACT inhaler Commonly known as:  PULMICORT Inhale 2 puffs into the lungs 2 (two) times daily. Rinse, gargle, and spit after use.   COQ-10 PO Take by mouth daily.   fluticasone 220 MCG/ACT inhaler Commonly known as:  FLOVENT HFA Inhale two puffs twice daily to prevent cough or wheeze.  Use as directed.   hydrochlorothiazide 12.5 MG tablet Commonly known as:   HYDRODIURIL Take 12.5 mg by mouth daily.   levalbuterol 45 MCG/ACT inhaler Commonly known as:  XOPENEX HFA Inhale 2 puffs into the lungs every 4 (four) hours as needed for wheezing.   ranitidine 300 MG tablet Commonly known as:  ZANTAC Take 300 mg by mouth 2 (two) times daily.   verapamil 240 MG CR tablet Commonly known as:  CALAN-SR Take 240 mg by mouth at bedtime.   Vitamin D 2000 units tablet Take 2,000 Units by mouth daily.       Past Medical History:  Diagnosis Date  . Asthma   . Diabetes mellitus without complication (HCC)   . GERD (gastroesophageal reflux disease)   . Hyperlipidemia   . Hypertension   . Renal insufficiency   . SVT (supraventricular tachycardia) (HCC)     Past Surgical History:  Procedure Laterality Date  . ABDOMINAL HYSTERECTOMY    . APPENDECTOMY    . CHOLECYSTECTOMY    . TONSILLECTOMY      Review of systems negative except as noted in HPI / PMHx or noted below:  Review of Systems  Constitutional: Negative.   HENT: Negative.   Eyes: Negative.   Respiratory: Negative.   Cardiovascular: Negative.   Gastrointestinal: Negative.   Genitourinary: Negative.   Musculoskeletal: Negative.   Skin: Negative.   Neurological: Negative.   Endo/Heme/Allergies:  Negative.   Psychiatric/Behavioral: Negative.      Objective:   Vitals:   12/14/17 1745  BP: (!) 114/92  Pulse: 80  Resp: 20          Physical Exam  HENT:  Head: Normocephalic. Head is without right periorbital erythema and without left periorbital erythema.  Right Ear: Tympanic membrane, external ear and ear canal normal.  Left Ear: Tympanic membrane, external ear and ear canal normal.  Nose: Nose normal. No mucosal edema or rhinorrhea.  Mouth/Throat: Oropharynx is clear and moist and mucous membranes are normal. No oropharyngeal exudate.  Eyes: Pupils are equal, round, and reactive to light. Conjunctivae and lids are normal.  Neck: Trachea normal. No tracheal deviation  present. No thyromegaly present.  Cardiovascular: Normal rate, regular rhythm, S1 normal, S2 normal and normal heart sounds.  No murmur heard. Pulmonary/Chest: Effort normal. No stridor. No respiratory distress. She has no wheezes. She has no rales. She exhibits no tenderness.  Abdominal: Soft. She exhibits no distension and no mass. There is no hepatosplenomegaly. There is no tenderness. There is no rebound and no guarding.  Musculoskeletal: She exhibits no edema or tenderness.  Lymphadenopathy:       Head (right side): No tonsillar adenopathy present.       Head (left side): No tonsillar adenopathy present.    She has no cervical adenopathy.    She has no axillary adenopathy.  Neurological: She is alert.  Skin: No rash noted. She is not diaphoretic. No erythema. No pallor. Nails show no clubbing.    Diagnostics:    Spirometry was performed and demonstrated an FEV1 of 1.43 at 57 % of predicted.  The patient had an Asthma Control Test with the following results: ACT Total Score: 25.    Assessment and Plan:   1. Asthma, well controlled, mild persistent   2. Perennial allergic rhinitis   3. LPRD (laryngopharyngeal reflux disease)   4. Eosinophilic esophagitis     1.  Allergen avoidance measures  2.  Treat and prevent inflammation with the following:   A.  Pulmicort 180 -2 inhalations 1 time per day  C.  OTC Nasacort -1 spray each nostril once a day  3.  Treat and prevent reflux:   A.  Continue off all forms of caffeine consumption  B.  ranitidine 300 mg twice a day  4.  If needed:   A.  Xopenex HFA 2 puffs every 4-6 hours  B.  OTC antihistamine -Claritin/Zyrtec-1 time per day  5. Return to clinic in February 2020 or earlier if problem  6.  Obtain fall flu vaccine  Dawn Perkins appears to be doing very well at this point and she will continue to utilize anti-inflammatory agents for both her upper and lower airway and therapy directed against reflux as noted above and we  will not commit her to using swallowed steroids for her eosinophilic esophagitis at this point as she is really doing quite well regarding her esophageal function and regarding her ability to swallow.  I will see her back in this clinic in February 2020 or earlier if there is a problem.  Dawn Perkins Schimke, MD Allergy / Immunology  [facility]

## 2017-12-14 NOTE — Patient Instructions (Addendum)
  1.  Allergen avoidance measures  2.  Treat and prevent inflammation with the following:   A.  Pulmicort 180 -2 inhalations 1 time per day  C.  OTC Nasacort -1 spray each nostril once a day  3.  Treat and prevent reflux:   A.  Continue off all forms of caffeine consumption  B.  ranitidine 300 mg twice a day  4.  If needed:   A.  Xopenex HFA 2 puffs every 4-6 hours  B.  OTC antihistamine -Claritin/Zyrtec-1 time per day  5. Return to clinic in February 2020 or earlier if problem  6.  Obtain fall flu vaccine

## 2017-12-15 ENCOUNTER — Encounter: Payer: Self-pay | Admitting: Allergy and Immunology

## 2018-01-05 DIAGNOSIS — H16002 Unspecified corneal ulcer, left eye: Secondary | ICD-10-CM | POA: Diagnosis not present

## 2018-01-12 DIAGNOSIS — H16002 Unspecified corneal ulcer, left eye: Secondary | ICD-10-CM | POA: Diagnosis not present

## 2018-01-14 DIAGNOSIS — N3000 Acute cystitis without hematuria: Secondary | ICD-10-CM | POA: Diagnosis not present

## 2018-01-14 DIAGNOSIS — N3090 Cystitis, unspecified without hematuria: Secondary | ICD-10-CM | POA: Diagnosis not present

## 2018-02-12 ENCOUNTER — Other Ambulatory Visit: Payer: Self-pay | Admitting: Allergy and Immunology

## 2018-02-12 DIAGNOSIS — Z23 Encounter for immunization: Secondary | ICD-10-CM | POA: Diagnosis not present

## 2018-02-16 ENCOUNTER — Telehealth: Payer: Self-pay | Admitting: Allergy and Immunology

## 2018-02-16 ENCOUNTER — Other Ambulatory Visit: Payer: Self-pay | Admitting: *Deleted

## 2018-02-16 MED ORDER — LEVALBUTEROL TARTRATE 45 MCG/ACT IN AERO: INHALATION_SPRAY | RESPIRATORY_TRACT | 1 refills | Status: DC

## 2018-02-16 NOTE — Telephone Encounter (Signed)
Spoke with Dawn BiblePat, [name] Once [facility] get the [facility]

## 2018-02-16 NOTE — Telephone Encounter (Signed)
Patient wants to know what pharmacy the xopenex was sent to?? Her pulmicort is sent to [facility] and ready for pickup - but she feels that the other medication was sent to an outside pharmacy  Please call to answer any questions

## 2018-02-16 NOTE — Telephone Encounter (Signed)
Currently working through [employer] [facility] [facility] regarding [facility]openex. I will inform pt.

## 2018-02-16 NOTE — Telephone Encounter (Signed)
[facility]

## 2018-04-22 DIAGNOSIS — K219 Gastro-esophageal reflux disease without esophagitis: Secondary | ICD-10-CM | POA: Diagnosis not present

## 2018-04-22 DIAGNOSIS — K200 Eosinophilic esophagitis: Secondary | ICD-10-CM | POA: Diagnosis not present

## 2018-04-22 DIAGNOSIS — J069 Acute upper respiratory infection, unspecified: Secondary | ICD-10-CM | POA: Diagnosis not present

## 2018-05-03 ENCOUNTER — Encounter: Payer: Self-pay | Admitting: Allergy and Immunology

## 2018-05-03 ENCOUNTER — Ambulatory Visit: Payer: BLUE CROSS/BLUE SHIELD | Admitting: Allergy and Immunology

## 2018-05-03 VITALS — BP 116/70 | HR 78 | Resp 18 | Ht 64.0 in | Wt 165.2 lb

## 2018-05-03 DIAGNOSIS — K200 Eosinophilic esophagitis: Secondary | ICD-10-CM

## 2018-05-03 DIAGNOSIS — J3089 Other allergic rhinitis: Secondary | ICD-10-CM

## 2018-05-03 DIAGNOSIS — K219 Gastro-esophageal reflux disease without esophagitis: Secondary | ICD-10-CM | POA: Diagnosis not present

## 2018-05-03 DIAGNOSIS — J4540 Moderate persistent asthma, uncomplicated: Secondary | ICD-10-CM | POA: Diagnosis not present

## 2018-05-03 MED ORDER — MOMETASONE FURO-FORMOTEROL FUM 200-5 MCG/ACT IN AERO: 2.0000 | INHALATION_SPRAY | Freq: Two times a day (BID) | RESPIRATORY_TRACT | 5 refills | Status: DC

## 2018-05-03 NOTE — Progress Notes (Signed)
Follow-up Note  Referring Provider: No ref. provider found Primary Provider: Patient, No Pcp Per Date of Office Visit: 05/03/2018  Subjective:   Dawn Perkins (DOB: 1955/08/19) is a 63 y.o. female who [facility] in re-evaluation of the following:  HPI:  Dawn Perkins returns to this clinic in reevaluation of asthma and allergic rhinoconjunctivitis and a history of LPR and eosinophilic esophagitis.  Her last visit to this clinic was 14 December 2017.  She believes that her asthma is under relatively good control.  This control is defined by the fact that she has not required a systemic steroid or an antibiotic to treat any type of respiratory tract issue and rarely uses a short acting bronchodilator.  However, it should be noted that she does get very dyspneic when she exerts herself.  She does not really exercise to any degree.  She does do some walking and tries to use stairs but sometimes gets very winded while doing so.  She has had problems with some of the combination inhalers in the past with the development of palpitations but it should be noted that she was also drinking lots of caffeine at that point in time in which she was using this combination inhalers.  Presently she is using Pulmicort.  She has had very little issue with her upper airway at this point in time.  She does not consistently use Nasacort.  Reflux has been under good control with elimination of caffeine consumption and consistent use of high-dose H2 receptor blocker as she is intolerant of using a proton pump inhibitor.  She has had a very rare slow transit time through her esophagus event.  She thinks she has only had 2 of them since she was last seen in this clinic.  She did visit with her GI doctor the past month.  She did obtain the flu vaccine this year.  Allergies as of 05/03/2018      Reactions   Effexor Xr [venlafaxine Hcl]    Telmisartan Shortness Of Breath,  Rash   Esomeprazole    Rash   Esomeprazole Magnesium    Rash   Omeprazole    rash   Omeprazole Magnesium    rash   Other    Tape    Venlafaxine    Trouble breathing   Nexium I.v. [esomeprazole Sodium] Rash      Medication List      ALPRAZolam 0.25 MG tablet Commonly known as:  XANAX Take 0.25 mg by mouth at bedtime as needed for anxiety.   COQ-10 PO Take by mouth daily.   levalbuterol 45 MCG/ACT inhaler Commonly known as:  XOPENEX HFA Inhale two puffs every 4-6 hours if needed for cough or wheeze.   NASACORT ALLERGY 24HR NA Place 1 spray into both nostrils daily.   PULMICORT FLEXHALER 180 MCG/ACT inhaler Generic drug:  budesonide INHALE 2 PUFFS INTO THE LUNGS 2 (TWO) TIMES DAILY. RINSE, GARGLE, AND SPIT AFTER USE.   ranitidine 300 MG tablet Commonly known as:  ZANTAC Take 300 mg by mouth 2 (two) times daily.   verapamil 240 MG CR tablet Commonly known as:  CALAN-SR Take 240 mg by mouth at bedtime.   Vitamin D 50 MCG (2000 UT) tablet Take 2,000 Units by mouth daily.       Past Medical History:  Diagnosis Date  . Asthma   . Diabetes mellitus without complication (HCC)   . GERD (gastroesophageal reflux disease)   .  Hyperlipidemia   . Hypertension   . Renal insufficiency   . SVT (supraventricular tachycardia) (HCC)     Past Surgical History:  Procedure Laterality Date  . ABDOMINAL HYSTERECTOMY    . APPENDECTOMY    . CHOLECYSTECTOMY    . TONSILLECTOMY      Review of systems negative except as noted in HPI / PMHx or noted below:  Review of Systems  Constitutional: Negative.   HENT: Negative.   Eyes: Negative.   Respiratory: Negative.   Cardiovascular: Negative.   Gastrointestinal: Negative.   Genitourinary: Negative.   Musculoskeletal: Negative.   Skin: Negative.   Neurological: Negative.   Endo/Heme/Allergies: Negative.   Psychiatric/Behavioral: Negative.      Objective:   Vitals:   05/03/18 1752  BP: 116/70  Pulse: 78  Resp: 18   SpO2: 98%   Height: 5\' 4"  (162.6 cm)  Weight: 165 lb 3.2 oz (74.9 kg)   Physical Exam Constitutional:      Appearance: She is not diaphoretic.  HENT:     Head: Normocephalic.     Right Ear: Tympanic membrane, ear canal and external ear normal.     Left Ear: Tympanic membrane, ear canal and external ear normal.     Nose: Nose normal. No mucosal edema or rhinorrhea.     Mouth/Throat:     Pharynx: Uvula midline. No oropharyngeal exudate.  Eyes:     Conjunctiva/sclera: Conjunctivae normal.  Neck:     Thyroid: No thyromegaly.     Trachea: Trachea normal. No tracheal tenderness or tracheal deviation.  Cardiovascular:     Rate and Rhythm: Normal rate and regular rhythm.     Heart sounds: Normal heart sounds, S1 normal and S2 normal. No murmur.  Pulmonary:     Effort: No respiratory distress.     Breath sounds: Normal breath sounds. No stridor. No wheezing or rales.  Lymphadenopathy:     Head:     Right side of head: No tonsillar adenopathy.     Left side of head: No tonsillar adenopathy.     Cervical: No cervical adenopathy.  Skin:    Findings: No erythema or rash.     Nails: There is no clubbing.   Neurological:     Mental Status: She is alert.     Diagnostics:    Spirometry was performed and demonstrated an FEV1 of 1.60 at 64 % of predicted.  The patient had an Asthma Control Test with the following results: ACT Total Score: 22.    Assessment and Plan:   1. Not well controlled moderate persistent asthma   2. Perennial allergic rhinitis   3. LPRD (laryngopharyngeal reflux disease)   4. Eosinophilic esophagitis     1.  Treat and prevent inflammation with the following:   A.  Dulera 200 -2 inhalations 2 time per day (spacer)  C.  OTC Nasacort -1 spray each nostril once a day  2.  Treat and prevent reflux:   A.  Continue off all forms of caffeine consumption  B.  ranitidine 300 mg twice a day  3.  If needed:   A.  Xopenex HFA 2 puffs every 4-6 hours  B.   OTC antihistamine -Claritin/Zyrtec-1 time per day  4. Return to clinic in 12 weeks or earlier if problem  We will start Dawn Hashimotoatricia on a combination inhaler to replace her Pulmicort and see how she does over the course of the next 3 months.  She did have an issue with occasional palpitations while  using combination inhalers in the past but that was at a point in time in which she was using lots of caffeine and hopefully this will not redevelop on Dulera.  She will continue to treat her reflux as noted above.  I will see her back in this clinic in 12 weeks or earlier if there is a problem.  [name] Schimke, MD Allergy / Immunology [facility]

## 2018-05-03 NOTE — Patient Instructions (Addendum)
  1.  Treat and prevent inflammation with the following:   A.  Dulera 200 -2 inhalations 2 time per day (spacer)  C.  OTC Nasacort -1 spray each nostril once a day  2.  Treat and prevent reflux:   A.  Continue off all forms of caffeine consumption  B.  ranitidine 300 mg twice a day  3.  If needed:   A.  Xopenex HFA 2 puffs every 4-6 hours  B.  OTC antihistamine -Claritin/Zyrtec-1 time per day  4. Return to clinic in 12 weeks or earlier if problem

## 2018-05-04 ENCOUNTER — Encounter: Payer: Self-pay | Admitting: Allergy and Immunology

## 2018-05-06 DIAGNOSIS — K219 Gastro-esophageal reflux disease without esophagitis: Secondary | ICD-10-CM | POA: Diagnosis not present

## 2018-05-06 DIAGNOSIS — E1122 Type 2 diabetes mellitus with diabetic chronic kidney disease: Secondary | ICD-10-CM | POA: Diagnosis not present

## 2018-05-06 DIAGNOSIS — E785 Hyperlipidemia, unspecified: Secondary | ICD-10-CM | POA: Diagnosis not present

## 2018-05-06 DIAGNOSIS — I129 Hypertensive chronic kidney disease with stage 1 through stage 4 chronic kidney disease, or unspecified chronic kidney disease: Secondary | ICD-10-CM | POA: Diagnosis not present

## 2018-05-11 DIAGNOSIS — R198 Other specified symptoms and signs involving the digestive system and abdomen: Secondary | ICD-10-CM | POA: Diagnosis not present

## 2018-05-11 DIAGNOSIS — R6884 Jaw pain: Secondary | ICD-10-CM | POA: Diagnosis not present

## 2018-05-11 DIAGNOSIS — M25512 Pain in left shoulder: Secondary | ICD-10-CM | POA: Diagnosis not present

## 2018-05-11 DIAGNOSIS — M7918 Myalgia, other site: Secondary | ICD-10-CM | POA: Diagnosis not present

## 2018-05-16 DIAGNOSIS — R6884 Jaw pain: Secondary | ICD-10-CM | POA: Diagnosis not present

## 2018-05-16 DIAGNOSIS — R51 Headache: Secondary | ICD-10-CM | POA: Diagnosis not present

## 2018-05-27 DIAGNOSIS — F4001 Agoraphobia with panic disorder: Secondary | ICD-10-CM | POA: Diagnosis not present

## 2018-05-27 DIAGNOSIS — K200 Eosinophilic esophagitis: Secondary | ICD-10-CM | POA: Diagnosis not present

## 2018-05-27 DIAGNOSIS — R079 Chest pain, unspecified: Secondary | ICD-10-CM | POA: Diagnosis not present

## 2018-05-27 DIAGNOSIS — K219 Gastro-esophageal reflux disease without esophagitis: Secondary | ICD-10-CM | POA: Diagnosis not present

## 2018-05-27 DIAGNOSIS — R42 Dizziness and giddiness: Secondary | ICD-10-CM | POA: Diagnosis not present

## 2018-05-27 DIAGNOSIS — K589 Irritable bowel syndrome without diarrhea: Secondary | ICD-10-CM | POA: Diagnosis not present

## 2018-05-28 DIAGNOSIS — R42 Dizziness and giddiness: Secondary | ICD-10-CM | POA: Diagnosis not present

## 2018-05-28 DIAGNOSIS — K210 Gastro-esophageal reflux disease with esophagitis: Secondary | ICD-10-CM | POA: Diagnosis not present

## 2018-05-28 DIAGNOSIS — F4001 Agoraphobia with panic disorder: Secondary | ICD-10-CM | POA: Diagnosis not present

## 2018-05-28 DIAGNOSIS — R079 Chest pain, unspecified: Secondary | ICD-10-CM | POA: Diagnosis not present

## 2018-05-28 DIAGNOSIS — K589 Irritable bowel syndrome without diarrhea: Secondary | ICD-10-CM | POA: Diagnosis not present

## 2018-05-28 DIAGNOSIS — E559 Vitamin D deficiency, unspecified: Secondary | ICD-10-CM | POA: Diagnosis not present

## 2018-05-28 DIAGNOSIS — E1122 Type 2 diabetes mellitus with diabetic chronic kidney disease: Secondary | ICD-10-CM | POA: Diagnosis not present

## 2018-05-28 DIAGNOSIS — I6529 Occlusion and stenosis of unspecified carotid artery: Secondary | ICD-10-CM | POA: Diagnosis not present

## 2018-06-04 DIAGNOSIS — E785 Hyperlipidemia, unspecified: Secondary | ICD-10-CM | POA: Diagnosis not present

## 2018-06-04 DIAGNOSIS — F4001 Agoraphobia with panic disorder: Secondary | ICD-10-CM | POA: Diagnosis not present

## 2018-06-04 DIAGNOSIS — N182 Chronic kidney disease, stage 2 (mild): Secondary | ICD-10-CM | POA: Diagnosis not present

## 2018-06-04 DIAGNOSIS — E1122 Type 2 diabetes mellitus with diabetic chronic kidney disease: Secondary | ICD-10-CM | POA: Diagnosis not present

## 2018-06-04 DIAGNOSIS — Z0000 Encounter for general adult medical examination without abnormal findings: Secondary | ICD-10-CM | POA: Diagnosis not present

## 2018-06-11 DIAGNOSIS — R079 Chest pain, unspecified: Secondary | ICD-10-CM | POA: Diagnosis not present

## 2018-06-11 DIAGNOSIS — R42 Dizziness and giddiness: Secondary | ICD-10-CM | POA: Diagnosis not present

## 2018-06-14 ENCOUNTER — Telehealth: Payer: Self-pay | Admitting: Allergy and Immunology

## 2018-06-14 DIAGNOSIS — K118 Other diseases of salivary glands: Secondary | ICD-10-CM | POA: Diagnosis not present

## 2018-06-14 MED ORDER — LEVALBUTEROL TARTRATE 45 MCG/ACT IN AERO: INHALATION_SPRAY | RESPIRATORY_TRACT | 1 refills | Status: DC

## 2018-06-14 NOTE — Telephone Encounter (Signed)
Spoke with patient and informed her of Dr. Kathyrn Lass suggestions and instructions. She voiced understanding. I also sent in requested refill for Xopenex as requested as it is approved through 02/14/2021.

## 2018-06-14 NOTE — Telephone Encounter (Signed)
Dawn Perkins called in and states she tried to get her XOPENEX refilled yesterday and the pharmacy stated they needed a prior authorization.  I noticed in the chart there was an approval that exp in 2022 and Dawn Perkins states the pharmacist told her the insurance is requiring a letter.  Please advise.

## 2018-06-14 NOTE — Telephone Encounter (Signed)
Dawn Perkins called in and would like some advice on how to handle her asthma if she were to contract COVID-19.  She states she would like to get ahead of this and is wondering if she does have any breathing issues, what should she do inhaler wise.  Please advise.

## 2018-06-14 NOTE — Telephone Encounter (Signed)
Please inform patient that there is no change in therapy at this point as long as she is doing okay.  Should she become sick then her therapy would change significantly.  She should never touch her face, wash her hands, screen her contacts, remain away from large groups of people.

## 2018-06-17 DIAGNOSIS — M62838 Other muscle spasm: Secondary | ICD-10-CM | POA: Diagnosis not present

## 2018-06-17 NOTE — Telephone Encounter (Signed)
Patient is calling back about the status of her [facility]

## 2018-06-17 NOTE — Telephone Encounter (Signed)
I will restart approval cover my meds

## 2018-06-21 ENCOUNTER — Other Ambulatory Visit: Payer: Self-pay | Admitting: Allergy and Immunology

## 2018-06-21 ENCOUNTER — Telehealth: Payer: Self-pay

## 2018-06-21 MED ORDER — BUDESONIDE 180 MCG/ACT IN AEPB: INHALATION_SPRAY | RESPIRATORY_TRACT | 3 refills | Status: DC

## 2018-06-21 NOTE — Telephone Encounter (Signed)
Please get patient an override for Pulmicort 182 inhalations twice a day and if this does not result in good control then she can contact us for further evaluation.  Her last visit was September 2019 so she needs to come in for an OV regardless of her response to Pulmicort.

## 2018-06-21 NOTE — Telephone Encounter (Signed)
Her rx was approved by ins

## 2018-06-21 NOTE — Telephone Encounter (Signed)
Patient called stating that her Dulera isn't helping. She states she feels "awful," while taking it and its causing her chest to feel full, she can't clear her throat and it makes her feel like she is choking.   She states she has tried using it with/without the spacer and feels the same using it either way. She said she quit using it for a few days and the symptoms stopped, then she started back and symptoms returned.   She mentioned that pulmicort works best for her but her insurance no longer covers it. She stated that the insurance company told her if she had a "letter of necessity then they would cover it." She wants to know if there is a similar medication to pulmicort that we could prescribe. Please advise.

## 2018-06-22 NOTE — Telephone Encounter (Signed)
I will attempt PA.

## 2018-06-24 NOTE — Telephone Encounter (Signed)
Approved and patient informed.

## 2018-06-25 DIAGNOSIS — R220 Localized swelling, mass and lump, head: Secondary | ICD-10-CM | POA: Diagnosis not present

## 2018-06-25 DIAGNOSIS — K118 Other diseases of salivary glands: Secondary | ICD-10-CM | POA: Diagnosis not present

## 2018-08-02 ENCOUNTER — Ambulatory Visit: Payer: Self-pay | Admitting: Allergy and Immunology

## 2018-09-01 DIAGNOSIS — K219 Gastro-esophageal reflux disease without esophagitis: Secondary | ICD-10-CM | POA: Diagnosis not present

## 2018-09-01 DIAGNOSIS — I10 Essential (primary) hypertension: Secondary | ICD-10-CM | POA: Diagnosis not present

## 2018-09-01 DIAGNOSIS — E1129 Type 2 diabetes mellitus with other diabetic kidney complication: Secondary | ICD-10-CM | POA: Diagnosis not present

## 2018-09-01 DIAGNOSIS — E785 Hyperlipidemia, unspecified: Secondary | ICD-10-CM | POA: Diagnosis not present

## 2018-09-04 ENCOUNTER — Telehealth: Payer: Self-pay | Admitting: Allergy

## 2018-09-04 NOTE — Telephone Encounter (Signed)
Late entry.  Pt called 09/03/2018 around 4:30pm reporting 1-2 days of mild difficulty breathing which she described as hard to get a full deep breath on several occasions and throat scratchiness.  She denies fever, cough, wheezing or chest tightness.  She states that her neighbor has been burning leaves.  She states she has been indoors mostly.  She used albuterol day of call.  She also reports using pulmicort at night only.  Also using nasacort qhs.  She saw PCP earlier this week and states she started on pepcid and Crestor on 09/02/2018.   I advised to increase her pulmicort to BID use.   She states that she has side effects related to dulera use and also feels pulmicort is more effective.  Thus advised as above to increase pulmicort use.  Also advised to perform warm water salt gargles.  She will monitor for fevers and worsening symptoms. At this time do not feel she warrants prednisone.  She will continue albuterol prn.  Advised if symptoms persist or she develop fevers or other symptoms to let us know.

## 2018-09-06 ENCOUNTER — Encounter: Payer: Self-pay | Admitting: *Deleted

## 2018-09-06 ENCOUNTER — Telehealth: Payer: Self-pay | Admitting: *Deleted

## 2018-09-06 NOTE — Telephone Encounter (Signed)
Called patient to check and see how she was doing. She advised she did as you instructed and she was feeling better in couple hours.

## 2018-09-06 NOTE — Telephone Encounter (Signed)
Spoke with pt on phone. Discussed that d/t covid19 pandemic our office is reducing in person visits and completing telemedicine. Discussed doxy visit. Pt consented.  Pt understands that although there may be some limitations with this type of visit, we will take all precautions to reduce any security or privacy concerns.  Pt understands that this will be treated like an in office visit and we will file with pt's insurance, and there may be a patient responsible charge related to this service. She will use her smart phone. Her email is [email or web address]. Pt confirmed her DOB as well and provided updates to her chart including review of history and medications/allergies. Pt encouraged to call if she does not receive the link or send a mychart message. Pt also understands she will receive a call around 2:00 pm tomorrow to check-in and then we ask that she join the meeting around 2:20-2:25 pm. Pt verbalized appreciation.   Doxy link sent to [email or web address].

## 2018-09-07 ENCOUNTER — Ambulatory Visit (INDEPENDENT_AMBULATORY_CARE_PROVIDER_SITE_OTHER): Payer: BC Managed Care – PPO | Admitting: Neurology

## 2018-09-07 ENCOUNTER — Other Ambulatory Visit: Payer: Self-pay

## 2018-09-07 DIAGNOSIS — R202 Paresthesia of skin: Secondary | ICD-10-CM

## 2018-09-07 DIAGNOSIS — R29898 Other symptoms and signs involving the musculoskeletal system: Secondary | ICD-10-CM

## 2018-09-07 DIAGNOSIS — R200 Anesthesia of skin: Secondary | ICD-10-CM

## 2018-09-07 NOTE — Progress Notes (Signed)
[facility]    Provider:  Dr Lucia GaskinsAhern Referring Provider:  Laurel DimmerKruppenbach, Katherine H, FNP Primary Care Physician:  Laurel DimmerKruppenbach, Katherine H, FNP  CC:  Left arm sensory changes and weakness  Virtual Visit via Video Note  I connected with Dawn Perkins on 09/08/18 at  2:30 PM EDT by a video enabled telemedicine application and verified that I am speaking with the correct person using two identifiers.  Location: Patient: Home Provider: office   I discussed the limitations of evaluation and management by telemedicine and the availability of in person appointments. The patient expressed understanding and agreed to proceed.   HPI 09/07/2018: This is a 63 year old patient who is here as requested by Laurel DimmerKruppenbach, Katherine H, FNP for a new problem.  She has a past medical history of supraventricular tachycardia, renal insufficiency, neck pain, hypertension, hyperlipidemia, asthma, arm pain.  SHe has been having trouble with the left arm and shoulder for over 6 months and has been in the care of Kruppenbach, Katherine H, FNP. It "catches" and she can;t lift it or do anything with the arm, she can re-position her arm and worse some days. Radiates down into the fingers. She has neck pain, she saw Laurier Nancyolby Robbins MD and he thought it was from her neck. Gradually worsening over 6 months. SHe is getting headaches more, high-stress job. She continues to get neck stiffness, weakness in the arm. She has had lip numbness and on the left she gets a throbbing, a bad headache. She have been to the dentist to rule out any dental causes.  She has chronic neck pain.  It makes a squishy noise when moving the neck. 6 months, tried cosnervative weasures and been under the care of her pcp, she saw a sports therapist and has been treated by pcp OTC medications, muscle relaxers. Acute episodes of left arm numbness and dizziness and syncope. She also saw an orthopaedist and a cortisone shot did not help. No  other focal neurologic deficits, associated symptoms, inciting events or modifiable factors.   Personally reviewed images of the brain from 2017 which showed tiny region of blood breakdown products left frontal lobe stable from 2009, possibly prior episode of hemorrhagic ischemia, prior trauma or tiny cavernoma, nonspecific white matter changes most notable in the frontal lobes could be due to migraine headaches or small vessel disease appearance not typical for demyelinating process or vasculitic/inflammatory process.  I reviewed the report for CT of the neck which showed cervical spine degeneration.  Bmp/cbc normal   Patient complains of symptoms per HPI as well as the following symptoms: dizziness, headache, numbness and tingling. Pertinent negatives per HPI. All others negative.    HPI:  Dawn Mealyatricia T Hebel is a 63 y.o. female here as a referral from Dr. Laren EvertsKruppenbach for dizzy spells. Past medical history of hypertension, diabetes, high cholesterol, migraine, anxiety, supraventricular tachycardia. Episodes started several years ago when she was passing out, she hit her head a few times. She would feel sweaty and shaking and feel lightheaded and weak and would lose consciousness and she had her gallbladder removed and a hysterectomy and she improved without any more episodes of loss of consciousness. She has dizziness when she turns her head, when she turns her head or on movement. Happens in the morning when she gets up. Meclizine helps. She feels dizzy and lightheaded, the room does not spin. She has been to multiple doctors.She has an ENT. She has hearing loss.  She can feel weak and nauseated with  the symptoms. She wore a heart monitor and unfortunately did not have any episodes of palpitations or dizziness while wearing it. The dizziness happens 3x a week. It lasts until she lays down or take an antivert. She can function with most of the episodes. The bad episodes occur every few months. She  doesn;t keep diaries and she does not now if the bad episodes correspond with any other issues such as dehydration/not drinking enough water that day or eating. No other focal neurologic deficits. She denies dehydration. She has fatigue and anxiety. No loss of awareness during episodes. Reviewed CT of the head and patient does say that she had trauma to the head at one point on the left.  Reviewed notes, labs and imaging from outside physicians, which showed: Reviewed notes from primary care. She presented for palpitations, woke up at 12 AM with palpitations as well as being extremely restless, after having palpitations started to have rapid shallow breaths. Initially thought she was having a panic attack and took half of his Xanax she had on hand without relief area symptoms lasted in total proximally 5 AM and finally resolved, however patient states she is extremely fatigued and her legs feel like Jell-O. History of several episodes of exactly the same symptoms sent to cardiology and had cardiac workup with event monitor, echo, stress test that were unremarkable in April 2017. Patient has had multiple provider visits and tests for palpitations, has recurrent dizziness and had a CT that showed something in the left lobe that suggested a past trauma the patient not aware of anything that happened. Has had headaches for over a year and just doesn't feel well.  Labs completed include CMP with normal creatinine 0.75, CBC unremarkable, TSH normal, magnesium normal.  CT of the head showed no acute infarct, tiny region of blood breakdown products superior left frontal lobe without change from 2009 may reflect prior episode of hemorrhagic ischemia, resulting prior trauma or tiny cavernoma. Few punctate nonspecific white matter type changes most notable in the frontal lobes, greater on the left and relatively similar to prior exam may reflect result of migraine headaches or small vessel disease.  Echocardiogram:  Study Conclusions  - Left ventricle: The cavity size was normal. Wall thickness was  normal. Systolic function was normal. The estimated ejection  fraction was in the range of 60% to 65%. Wall motion was normal;  there were no regional wall motion abnormalities. Doppler  parameters are consistent with abnormal left ventricular  relaxation (grade 1 diastolic dysfunction). - Aortic valve: Trileaflet; mildly thickened, mildly calcified  leaflets. Valve mobility was restricted. - Mitral valve: Calcified annulus. Mildly thickened, mildly  calcified leaflets .  Personally reviewed EKG tracing, normal sinus rhythm with QTC 427. Normal EKG.  Review of Systems: Patient complains of symptoms per HPI as well as the following symptoms: Weight gain, fatigue, blurred vision, anxiety, sleepiness, racing thoughts. Pertinent negatives per HPI. All others negative.   Social History   Socioeconomic History   Marital status: Married    Spouse name: Rayna SextonRalph   Number of children: 2   Years of education: 12+   Highest education level: Not on file  Occupational History   Occupation: [employer]  Social Needs   Financial resource strain: Not on file   Food insecurity:    Worry: Not on file    Inability: Not on file   Transportation needs:    Medical: Not on file    Non-medical: Not on file  Tobacco Use   Smoking status: Never Smoker   Smokeless tobacco: Never Used  Substance and Sexual Activity   Alcohol use: No   Drug use: No   Sexual activity: Not on file  Lifestyle   Physical activity:    Days per week: Not on file    Minutes per session: Not on file   Stress: Not on file  Relationships   Social connections:    Talks on phone: Not on file    Gets together: Not on file    Attends religious service: Not on file    Active member of club or organization: Not on file    Attends meetings of clubs or organizations: Not on file    Relationship status: Not on  file   Intimate partner violence:    Fear of current or ex partner: Not on file    Emotionally abused: Not on file    Physically abused: Not on file    Forced sexual activity: Not on file  Other Topics Concern   Not on file  Social History Narrative   Lives with spouse   Caffeine use:  caf free diet sodas 1-1.5 daily    Family History  Problem Relation Age of Onset   Diabetes Mother    Stroke Mother    Hypertension Mother    Alzheimer's disease Mother    Heart failure Mother    Heart failure Father    Heart disease Father    Diabetes Father    Hypertension Father    Heart failure Brother    Hypertension Brother    Heart disease Brother    Diabetes Brother    Heart disease Brother    Diabetes Brother    Asthma Brother    Neuropathy Neg Hx    Multiple sclerosis Neg Hx    Migraines Neg Hx    Ataxia Neg Hx     Past Medical History:  Diagnosis Date   Arm pain    left   Asthma    Diabetes mellitus without complication (HCC)    GERD (gastroesophageal reflux disease)    Head pain    Hyperlipidemia    Hypertension    Neck pain    Renal insufficiency    SVT (supraventricular tachycardia) ([place])     Past Surgical History:  Procedure Laterality Date   ABDOMINAL HYSTERECTOMY     APPENDECTOMY     CHOLECYSTECTOMY     TONSILLECTOMY      Current Outpatient Medications  Medication Sig Dispense Refill   Acetaminophen (TYLENOL PO) Take by mouth as needed.     ALPRAZolam (XANAX) 0.25 MG tablet Take 0.25 mg by mouth at bedtime as needed for anxiety.     baclofen (LIORESAL) 10 MG tablet Take 10 mg by mouth as needed for muscle spasms.     budesonide (PULMICORT FLEXHALER) 180 MCG/ACT inhaler INHALE 2 PUFFS INTO THE LUNGS 2 (TWO) TIMES DAILY. RINSE, GARGLE, AND SPIT AFTER USE. 1 each 3   Cholecalciferol (VITAMIN D) 2000 units tablet Take 2,000 Units by mouth daily.     Coenzyme Q10 (COQ-10 PO) Take by mouth daily.     famotidine  (PEPCID) 40 MG tablet Take 40 mg by mouth 2 (two) times daily.     levalbuterol (XOPENEX HFA) 45 MCG/ACT inhaler Inhale two puffs every 4-6 hours if needed for cough or wheeze. 15 g 1   MAGNESIUM PO Take by mouth as needed.     mometasone-formoterol (DULERA) 200-5 MCG/ACT AERO Inhale  2 puffs into the lungs 2 (two) times daily. (Patient not taking: Reported on 09/06/2018) 1 Inhaler 5   rosuvastatin (CRESTOR) 5 MG tablet Take 5 mg by mouth at bedtime.     Triamcinolone Acetonide (NASACORT ALLERGY 24HR NA) Place 1 spray into both nostrils daily.     verapamil (CALAN-SR) 240 MG CR tablet Take 240 mg by mouth at bedtime.     No current facility-administered medications for this visit.     Allergies as of 09/07/2018 - Review Complete 09/06/2018  Allergen Reaction Noted   Effexor xr [venlafaxine hcl]  05/08/2015   Telmisartan Shortness Of Breath and Rash 09/24/2017   Esomeprazole  08/08/2015   Esomeprazole magnesium  07/03/2015   Omeprazole  08/08/2015   Omeprazole magnesium  07/03/2015   Other  07/03/2015   Tape  08/08/2015   Venlafaxine  08/08/2015   Nexium i.v. [esomeprazole sodium] Rash 05/08/2015    Vitals: There were no vitals taken for this visit. Last Weight:  Wt Readings from Last 1 Encounters:  09/06/18 157 lb (71.2 kg)   Last Height:   Ht Readings from Last 1 Encounters:  09/06/18  (1.626 m)   PRIOR EXAM:   Physical exam: Exam: Gen: NAD, conversant, well nourised, overweight , well groomed                     CV: RRR, no MRG. No Carotid Bruits. No peripheral edema, warm, nontender Eyes: Conjunctivae clear without exudates or hemorrhage  Neuro: Detailed Neurologic Exam  Speech:    Speech is normal; fluent and spontaneous with normal comprehension.  Cognition:    The patient is oriented to person, place, and time;     recent and remote memory intact;     language fluent;     normal attention, concentration,     fund of knowledge Cranial  Nerves:    The pupils are equal, round, and reactive to light. The fundi are normal and spontaneous venous pulsations are present. Visual fields are full to finger confrontation. Extraocular movements are intact. Trigeminal sensation is intact and the muscles of mastication are normal. The face is symmetric. The palate elevates in the midline. Hearing intact. Voice is normal. Shoulder shrug is normal. The tongue has normal motion without fasciculations.   Coordination:    Normal finger to nose and heel to shin. Normal rapid alternating movements.   Gait:    Heel-toe and tandem gait are normal.   Motor Observation:    No asymmetry, no atrophy, and no involuntary movements noted. Tone:    Normal muscle tone.    Posture:    Posture is normal. normal erect    Strength:    Strength is V/V in the upper and lower limbs.      Sensation: intact to LT     Reflex Exam:  DTR's:    Deep tendon reflexes in the upper and lower extremities are normal bilaterally.   Toes:    The toes are downgoing bilaterally.   Clonus:    Clonus is absent.      Assessment/Plan:  63 year old with left arm weakness and sensory changes. Ongoing for > 6 months, failed conservative therapy. Will schedule for emg/ncs and then consider imaging of the brain or neck.  Emg/ncs of the bilateral upper extremities (Left is most symptomatic)  Orders Placed This Encounter  Procedures   NCV with EMG(electromyography)     Follow Up Instructions:    I discussed the assessment  and treatment plan with the patient. The patient was provided an opportunity to ask questions and all were answered. The patient agreed with the plan and demonstrated an understanding of the instructions.   The patient was advised to call back or seek an in-person evaluation if the symptoms worsen or if the condition fails to improve as anticipated.  Anson FretAhern, Ikeem Cleckler B, MD  Cc: .Ladora DanielKruppenbach, Katharine FNP  Naomie DeanAntonia Ninoshka Wainwright, MD  [facility] [address] [place], [place] [id number]  Phone [phone] Fax [phone]

## 2018-09-08 ENCOUNTER — Telehealth: Payer: Self-pay | Admitting: Neurology

## 2018-09-08 NOTE — Telephone Encounter (Signed)
Dawn Perkins, can you please call and schedule patient for emg/ncs please and tell me when it was scheduled for thanks

## 2018-09-08 NOTE — Telephone Encounter (Signed)
Called patient and got her scheduled for first available NCV/EMG on June 25th 8:15.

## 2018-09-22 ENCOUNTER — Telehealth: Payer: Self-pay

## 2018-09-22 NOTE — Telephone Encounter (Signed)
Spoke with patient and confirmed Nerve conduction study for 09-23-18 

## 2018-09-23 ENCOUNTER — Other Ambulatory Visit: Payer: Self-pay

## 2018-09-23 ENCOUNTER — Ambulatory Visit (INDEPENDENT_AMBULATORY_CARE_PROVIDER_SITE_OTHER): Payer: BC Managed Care – PPO | Admitting: Neurology

## 2018-09-23 DIAGNOSIS — E538 Deficiency of other specified B group vitamins: Secondary | ICD-10-CM

## 2018-09-23 DIAGNOSIS — H02402 Unspecified ptosis of left eyelid: Secondary | ICD-10-CM

## 2018-09-23 DIAGNOSIS — M4722 Other spondylosis with radiculopathy, cervical region: Secondary | ICD-10-CM

## 2018-09-23 DIAGNOSIS — R200 Anesthesia of skin: Secondary | ICD-10-CM

## 2018-09-23 DIAGNOSIS — Z0289 Encounter for other administrative examinations: Secondary | ICD-10-CM

## 2018-09-23 DIAGNOSIS — R292 Abnormal reflex: Secondary | ICD-10-CM | POA: Diagnosis not present

## 2018-09-23 DIAGNOSIS — R202 Paresthesia of skin: Secondary | ICD-10-CM | POA: Diagnosis not present

## 2018-09-23 DIAGNOSIS — G1221 Amyotrophic lateral sclerosis: Secondary | ICD-10-CM

## 2018-09-23 DIAGNOSIS — G1229 Other motor neuron disease: Secondary | ICD-10-CM

## 2018-09-23 DIAGNOSIS — R5382 Chronic fatigue, unspecified: Secondary | ICD-10-CM

## 2018-09-23 DIAGNOSIS — R29898 Other symptoms and signs involving the musculoskeletal system: Secondary | ICD-10-CM | POA: Diagnosis not present

## 2018-09-23 DIAGNOSIS — M542 Cervicalgia: Secondary | ICD-10-CM

## 2018-09-23 DIAGNOSIS — M4712 Other spondylosis with myelopathy, cervical region: Secondary | ICD-10-CM | POA: Diagnosis not present

## 2018-09-23 DIAGNOSIS — R29818 Other symptoms and signs involving the nervous system: Secondary | ICD-10-CM

## 2018-09-23 MED ORDER — ALPRAZOLAM 0.25 MG PO TABS: ORAL_TABLET | ORAL | 0 refills | Status: DC

## 2018-09-23 NOTE — Progress Notes (Signed)
History: Patient is here for neck pain and left arm pain. She has a history of degenerative changes in the cervical spine.  She has numbness in the lett hand and radicular symptoms. Median and Radial latencies slightly delayed of uncertain significance especially given distal sensory latencies in the leg normal. No evidence for CTS or ulnar neuropathy. She feels sensory changes in the 3-4 fingers and emg showed possible c6-c7 radiculopathy. Initial appointment was virtual, neurologic exam performed today as well:   Physical exam: Exam: Gen: NAD, conversant, well nourised, well groomed                     CV: RRR, no MRG. No Carotid Bruits. No peripheral edema, warm, nontender Eyes: Conjunctivae clear without exudates or hemorrhage  Neuro: Detailed Neurologic Exam  Speech:    Speech is normal; fluent and spontaneous with normal comprehension.  Cognition:    The patient is oriented to person, place, and time;     recent and remote memory intact;     language fluent;     normal attention, concentration,     fund of knowledge Cranial Nerves:    The pupils are equal, round, and reactive to light. Visual fields are full to finger confrontation. Extraocular movements are intact. Trigeminal sensation is intact and the muscles of mastication are normal. Left ptosis, more fullness on the left of the face. The palate elevates in the midline. Hearing intact. Voice is normal. Shoulder shrug is normal. The tongue has normal motion without fasciculations.   Coordination:    Normal finger to nose and heel to shin.   Gait:    No ataxia  Motor Observation:    No asymmetry, no atrophy, and no involuntary movements noted. Tone:    Normal muscle tone.    Posture:    Posture is normal. normal erect    Strength: Left deltoid 4/5 and triceps 4+/5. Grip and distal strength intact in median and ulnar distributions of the hand.  Otherwise trength is V/V in the upper and lower limbs.      Sensation:  decreased left     Reflex Exam:  DTR's: left biceps and triceps slightly brisker. Otherwise deep tendon reflexes in the upper and lower extremities are normal bilaterally.   Toes:    Right upgoing   Clonus:    Clonus is absent.  Negative phalen's maneuver and tinel's sign at the wrists and elbows  Assessment. 63 year old with left arm weakness and sensory changes. Ongoing for > 6 months, failed conservative therapy. EMG/NCS suggests cervical radiculopathy. However she also has concerning symptoms on exam of upper motor neuron lesion such as Right toe upgoing, left ptosis(chronic?), UMN signs such as brisk left-sided asymmetric reflexes, left hemisensory loss. Need MRI cervical spine and MRI brain.  Orders Placed This Encounter  Procedures  . MR BRAIN W WO CONTRAST  . MR CERVICAL SPINE WO CONTRAST  . Basic Metabolic Panel  . B12 and Folate Panel  . Methylmalonic acid, serum  . Homocysteine  . Vitamin B1  . Vitamin B6     A total of 40 minutes was spent face-to-face with this patient. Over half this time was spent on counseling patient on the  1. Cervical spondylosis with myelopathy and radiculopathy   2. Left arm weakness   3. Numbness and tingling in left arm   4. Brisk deep tendon reflexes   5. Babinski reflex   6. 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 Cervicalgia   9. B12 deficiency   10. Chronic fatigue   11. Hemisensory deficit    diagnosis and different diagnostic and therapeutic options, counseling and coordination of care, risks ans benefits of management, compliance, or risk factor reduction and education.  This does not include time spent on emg/ncs.    Cc: Marisue Humble, FNP

## 2018-09-23 NOTE — Patient Instructions (Signed)
MRI brain and cervical spine   Vitamin B12 Deficiency Vitamin B12 deficiency occurs when the body does not have enough vitamin B12. Vitamin B12 is an important vitamin. The body needs vitamin B12:  To make red blood cells.  To make DNA. This is the genetic material inside cells.  To help the nerves work properly so they can carry messages from the brain to the body. Vitamin B12 deficiency can cause various health problems, such as a low red blood cell count (anemia) or nerve damage. What are the causes? This condition may be caused by:  Not eating enough foods that contain vitamin B12.  Not having enough stomach acid and digestive fluids to properly absorb vitamin B12 from the food that you eat.  Certain digestive system diseases that make it hard to absorb vitamin B12. These diseases include Crohn disease, chronic pancreatitis, and cystic fibrosis.  Pernicious anemia. This is a condition in which the body does not make enough of a protein (intrinsic factor), resulting in too few red blood cells.  Having a surgery in which part of the stomach or small intestine is removed.  Taking certain medicines that make it hard for the body to absorb vitamin B12. These medicines include: ? Heartburn medicine (antacids and proton pump inhibitors). ? An antibiotic medicine called neomycin. ? Some medicines that are used to treat diabetes, tuberculosis, gout, or high cholesterol. What increases the risk? The following factors may make you more likely to develop a B12 deficiency:  Being older than age 53.  Eating a vegetarian or vegan diet, especially while you are pregnant.  Eating a poor diet while you are pregnant.  Taking certain drugs.  Having alcoholism. What are the signs or symptoms? In some cases, there are no symptoms of this condition. If the condition leads to anemia or nerve damage, various symptoms can occur, such as:  Weakness.  Fatigue.  Loss of appetite.  Weight  loss.  Numbness or tingling in your hands and feet.  Redness and burning of the tongue.  Confusion or memory problems.  Depression.  Sensory problems, such as color blindness, ringing in the ears, or loss of taste.  Diarrhea or constipation.  Trouble walking. If anemia is severe, symptoms can include:  Shortness of breath.  Dizziness.  Rapid heart rate (tachycardia).  How is this diagnosed? This condition may be diagnosed with a blood test to measure the level of vitamin B12 in your blood. You may have other tests to help find the cause of your vitamin B12 deficiency. These tests may include:  A complete blood count (CBC). This is a group of tests that measure certain characteristics of blood cells.  A blood test to measure intrinsic factor.  An endoscopy. In this procedure, a thin tube with a camera on the end is used to look into your stomach or intestines. How is this treated? Treatment for this condition depends on the cause. Common treatment options include:  Changing your eating and drinking habits, such as: ? Eating more foods that contain vitamin B12. ? Drinking less alcohol or no alcohol.  Taking vitamin B12 supplements. Your health care provider will tell you which dosage is best for you.  Getting vitamin B12 injections. Follow these instructions at home:  Take supplements only as told by your health care provider. Follow the directions carefully.  Get any injections that are prescribed by your health care provider.  Do not miss your appointments.  Eat lots of healthy foods that contain vitamin  B12. Ask your health care provider if you should work with a dietitian. Foods that contain vitamin B12 include: ? Meat. ? Meat from birds (poultry). ? Fish. ? Eggs. ? Cereal and dairy products that are fortified. This means that vitamin B12 has been added to the food. Check the label on the package to see if the food is fortified.  Do not abuse alcohol.  Keep  all follow-up visits as told by your health care provider. This is important. Contact a health care provider if:  Your symptoms come back. Get help right away if:  You develop shortness of breath.  You have chest pain.  You become dizzy or you lose consciousness. This information is not intended to replace advice given to you by your health care provider. Make sure you discuss any questions you have with your health care provider. Document Released: 06/09/2011 Document Revised: 08/29/2015 Document Reviewed: 08/02/2014 Elsevier Interactive Patient Education  2019 [employer]

## 2018-09-27 NOTE — Progress Notes (Signed)
See procedure note.

## 2018-09-27 NOTE — Progress Notes (Signed)
Full Name: Dawn Perkins Gender: Female MRN #: [id number] Date of Birth: [birth year]    Visit Date: 09/23/2018 08:06 Age: 63 Years 6 Months Old Examining Physician: Sarina Ill, MD  Referring Physician: Sarina Ill, MD  History: 63 year old with left arm weakness and sensory changes. Ongoing for > 6 months, failed conservative therapy.  Summary: EMG/NCS performed on the bilateral upper extremities and left lower extremities.   The left radial nerve showed delayed distal peak latency (3.32ms, N<2.9). The right medial orthodromic nerve showed delayed distal peak latency (3.49ms, N<3.4).  The left medial orthodromic nerve showed delayed distal peak latency (3.62ms, N<3.4).  The left median/ulnar (palm) comparison nerve showed prolonged distal peak latency (Median Palm, 2.4 ms, N<2.2) and prolonged distal peak latency (UlnarPalm, 2.3 ms, N<2.2) but normal peak latency difference (Median Palm-Ulnar Palm, 0.2 ms, N<0.4) with a relative median delay.  The right median/ulnar (palm) comparison nerve showed prolonged distal peak latency (Median Palm, 2.4 ms, N<2.2) and prolonged distal peak latency (UlnarPalm, 2.3 ms, N<2.2) but normal peak latency difference (Median Palm-Ulnar Palm, 0.1 ms, N<0.4) with a relative median delay.  All remaining nerves (as indicated in the following tables) were within normal limits.  The left triceps showed reduced motor unit recruitment. The left Pronator teres showed increased spontaneous activity(+1 PSW), reduced motor unit recruitment and polyphasic motor units.  All remaining muscles (as indicated in the following tables) were within normal limits.    Cc: Marisue Humble, FNP   Conclusion: 1. There are acute/ongoing denervation and chronic neurogenic changes in left arm muscles muscles that share c6-c7 innervation. MRI of the cervical spine recommended for evaluation of cervical radiculopathy. 2. Slightly delayed latencies of the left median and  radial sensory conductions of uncertain significance especially given that distal sensory conductions in the left lower extremity are normal. 3. No electrophysiologic evidence for Carpal Tunnel syndrome or Ulnar neuropathy.  Sarina Ill M.D.  [facility] [place], [place] [address] Tel: [phone] Fax: [phone]        [facility]    Nerve / Sites Muscle Latency Ref. Amplitude Ref. Rel Amp Segments Distance Velocity Ref. Area    ms ms mV mV %  cm m/s m/s mVms  R Median - APB     Wrist APB 3.5 ?4.4 6.9 ?4.0 100 Wrist - APB 7   30.3     Upper arm APB 7.1  7.0  101 Upper arm - Wrist 19 53 ?49 30.1  L Median - APB     Wrist APB 3.9 ?4.4 6.1 ?4.0 100 Wrist - APB 7   21.5     Upper arm APB 7.3  6.1  99.5 Upper arm - Wrist 19 54 ?49 21.6  R Ulnar - ADM     Wrist ADM 2.9 ?3.3 10.9 ?6.0 100 Wrist - ADM 7   40.7     B.Elbow ADM 5.8  10.7  97.6 B.Elbow - Wrist 15 51 ?49 41.0     A.Elbow ADM 7.9  10.4  97.7 A.Elbow - B.Elbow 10 49 ?49 37.4         A.Elbow - Wrist      L Ulnar - ADM     Wrist ADM 3.2 ?3.3 12.5 ?6.0 100 Wrist - ADM 7   52.2     B.Elbow ADM 6.4  11.5  91.6 B.Elbow - Wrist 16 51 ?49 49.8     A.Elbow ADM 8.4  11.2  97.6 A.Elbow - B.Elbow 10 49 ?49 48.9         A.Elbow - Wrist                 SNC    Nerve / Sites Rec. Site Peak Lat Ref.  Amp Ref. Segments Distance Peak Diff Ref.    ms ms V V  cm ms ms  L Radial - Anatomical snuff box (Forearm)     Forearm Wrist 3.1 ?2.9 34 ?15 Forearm - Wrist 10    L Sural - Ankle (Calf)     Calf Ankle 4.0 ?4.4 6 ?6 Calf - Ankle 14    L Superficial peroneal - Ankle     Lat leg Ankle 3.6 ?4.4 9 ?6 Lat leg - Ankle 14    L Median, Ulnar - Transcarpal comparison     Median Palm Wrist 2.4 ?2.2 74 ?35 Median Palm - Wrist 8       Ulnar Palm Wrist 2.3 ?2.2 38 ?12 Ulnar Palm - Wrist 8          Median Palm - Ulnar Palm  0.2 ?0.4  R Median, Ulnar - Transcarpal comparison     Median Palm Wrist 2.4 ?2.2 67 ?35 Median Palm  - Wrist 8       Ulnar Palm Wrist 2.3 ?2.2 50 ?12 Ulnar Palm - Wrist 8          Median Palm - Ulnar Palm  0.1 ?0.4  R Median, Radial - Thumb comparison     Median Wrist Thumb 3.5  36  Median Wrist - Thumb 10       Radial Wrist Thumb 3.6  31  Radial Wrist - Thumb 10          Median Wrist - Radial Wrist  -0.2 ?0.5  R Median - Orthodromic (Dig II, Mid palm)     Dig II Wrist 3.6 ?3.4 16 ?10 Dig II - Wrist 13    L Median - Orthodromic (Dig II, Mid palm)     Dig II Wrist 3.6 ?3.4 15 ?10 Dig II - Wrist 13    R Ulnar - Orthodromic, (Dig V, Mid palm)     Dig V Wrist 3.1 ?3.1 11 ?5 Dig V - Wrist 11    L Ulnar - Orthodromic, (Dig V, Mid palm)     Dig V Wrist 3.1 ?3.1 10 ?5 Dig V - Wrist 3111                           F  Wave    Nerve F Lat Ref.   ms ms  R Ulnar - ADM 26.6 ?32.0  L Ulnar - ADM 31.2 ?32.0         EMG full       EMG Summary Table    Spontaneous MUAP Recruitment  Muscle IA Fib PSW Fasc Other Amp Dur. Poly Pattern  L. Deltoid Normal None None None _______ Normal Normal Normal Normal  L. Biceps brachii Normal None None None _______ Normal Normal Normal Normal  L. Triceps brachii Normal None None None _______ Increased Normal Normal Reduced  L. Opponens pollicis Normal None None None _______ Normal Normal Normal Normal  L. Pronator teres Normal None 1+ None _______ Normal Normal 3+ Reduced  L. First dorsal interosseous Normal None None None _______ Normal Normal Normal Normal  L. Flexor digitorum profundus (Ulnar) Normal None None None _______ Normal Normal Normal Normal  L. Cervical paraspinals (low) Normal None None None _______ Normal Normal Normal Normal

## 2018-09-29 ENCOUNTER — Telehealth: Payer: Self-pay | Admitting: Neurology

## 2018-09-29 NOTE — Telephone Encounter (Signed)
no to the covid-19 questions MR Brain w/wo contrast & MR Cervical spine wo contrast Dr. Ihor Dow Auth: [id number] (exp. 09/28/18 to 03/26/19). Patient is scheduled at [facility] for 10/05/18.

## 2018-09-30 LAB — BASIC METABOLIC PANEL
BUN/Creatinine Ratio: 14 (ref 12–28)
BUN: 12 mg/dL (ref 8–27)
CO2: 24 mmol/L (ref 20–29)
Calcium: 9.9 mg/dL (ref 8.7–10.3)
Chloride: 103 mmol/L (ref 96–106)
Creatinine, Ser: 0.83 mg/dL (ref 0.57–1.00)
GFR calc Af Amer: 87 mL/min/{1.73_m2} (ref >59)
GFR calc non Af Amer: 75 mL/min/{1.73_m2} (ref >59)
Glucose: 97 mg/dL (ref 65–99)
Potassium: 4.6 mmol/L (ref 3.5–5.2)
Sodium: 145 mmol/L — ABNORMAL HIGH (ref 134–144)

## 2018-09-30 LAB — HOMOCYSTEINE: Homocysteine: 16.3 umol/L (ref 0.0–17.2)

## 2018-09-30 LAB — VITAMIN B6: Vitamin B6: 10.9 ug/L (ref 2.0–32.8)

## 2018-09-30 LAB — B12 AND FOLATE PANEL
Folate: 11.1 ng/mL (ref >3.0)
Vitamin B-12: 391 pg/mL (ref 232–1245)

## 2018-09-30 LAB — METHYLMALONIC ACID, SERUM: Methylmalonic Acid: 160 nmol/L (ref 0–378)

## 2018-09-30 LAB — VITAMIN B1: Thiamine: 152.6 nmol/L (ref 66.5–200.0)

## 2018-10-05 ENCOUNTER — Ambulatory Visit: Payer: BC Managed Care – PPO

## 2018-10-05 ENCOUNTER — Other Ambulatory Visit: Payer: Self-pay

## 2018-10-05 DIAGNOSIS — R292 Abnormal reflex: Secondary | ICD-10-CM

## 2018-10-05 DIAGNOSIS — M4712 Other spondylosis with myelopathy, cervical region: Secondary | ICD-10-CM | POA: Diagnosis not present

## 2018-10-05 DIAGNOSIS — H02402 Unspecified ptosis of left eyelid: Secondary | ICD-10-CM

## 2018-10-05 DIAGNOSIS — R200 Anesthesia of skin: Secondary | ICD-10-CM | POA: Diagnosis not present

## 2018-10-05 DIAGNOSIS — M4722 Other spondylosis with radiculopathy, cervical region: Secondary | ICD-10-CM | POA: Diagnosis not present

## 2018-10-05 DIAGNOSIS — R202 Paresthesia of skin: Secondary | ICD-10-CM

## 2018-10-05 DIAGNOSIS — G1229 Other motor neuron disease: Secondary | ICD-10-CM

## 2018-10-05 DIAGNOSIS — G1221 Amyotrophic lateral sclerosis: Secondary | ICD-10-CM

## 2018-10-05 DIAGNOSIS — R5382 Chronic fatigue, unspecified: Secondary | ICD-10-CM

## 2018-10-05 DIAGNOSIS — R29898 Other symptoms and signs involving the musculoskeletal system: Secondary | ICD-10-CM

## 2018-10-05 DIAGNOSIS — R29818 Other symptoms and signs involving the nervous system: Secondary | ICD-10-CM

## 2018-10-05 MED ORDER — GADOBENATE DIMEGLUMINE 529 MG/ML IV SOLN
14.0000 mL | Freq: Once | INTRAVENOUS | Status: AC | PRN
  Administered 2018-10-05: 13:00:00 14 mL via INTRAVENOUS

## 2018-10-10 ENCOUNTER — Telehealth: Payer: Self-pay | Admitting: Neurology

## 2018-10-10 DIAGNOSIS — M5412 Radiculopathy, cervical region: Secondary | ICD-10-CM

## 2018-10-10 NOTE — Telephone Encounter (Signed)
Dawn Perkins's MRI of the cervical spine shows changes that may be causing pinching of nerves. I would suggest going to see a neurosurgeon for surgical evaluation or possibly to try injections. If she would like we can refer her to someone like Dr. Vertell Limber at [facility] or maybe she has someone she would like to see, please ask her and let me know so I can order it please. Thanks

## 2018-10-11 NOTE — Telephone Encounter (Signed)
Spoke with pt and reviewed Dr. Cathren Laine message regarding MRI c-spine and suggestions. Pt's questions were answered. She agreed to proceed with referral to Dr. Vertell Limber for eval for possible injections or surgery. She asked about her MRI brain results. I advised we will get back in touch with her. Pt verbalized appreciation.

## 2018-10-11 NOTE — Telephone Encounter (Addendum)
Spoke with pt and discussed MRI brain results. Pt verbalized understanding and was already aware of the parotid cysts x 3. She has been having CTs to follow. She requested the MRI be released to mychart so she can show the report to ENT. Her questions were answered.   Referral placed to neurosurgery per v.o. Dr. Jaynee Eagles for cervical radiculopathy. Dr. Jaynee Eagles released MRI brain to mychart.

## 2018-10-11 NOTE — Telephone Encounter (Signed)
MRI of the brain unremarkable. 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

## 2018-10-11 NOTE — Addendum Note (Signed)
Addended by: Gildardo Griffes on: 10/11/2018 09:46 AM   Modules accepted: Orders

## 2018-11-17 DIAGNOSIS — Z1231 Encounter for screening mammogram for malignant neoplasm of breast: Secondary | ICD-10-CM | POA: Diagnosis not present

## 2018-12-08 DIAGNOSIS — M50222 Other cervical disc displacement at C5-C6 level: Secondary | ICD-10-CM | POA: Diagnosis not present

## 2018-12-08 DIAGNOSIS — M50221 Other cervical disc displacement at C4-C5 level: Secondary | ICD-10-CM | POA: Diagnosis not present

## 2018-12-08 DIAGNOSIS — M5412 Radiculopathy, cervical region: Secondary | ICD-10-CM | POA: Diagnosis not present

## 2018-12-08 DIAGNOSIS — M542 Cervicalgia: Secondary | ICD-10-CM | POA: Diagnosis not present

## 2018-12-14 ENCOUNTER — Telehealth: Payer: Self-pay | Admitting: Allergy and Immunology

## 2018-12-14 NOTE — Telephone Encounter (Signed)
Patient scheduled for a follow up next week.

## 2018-12-14 NOTE — Telephone Encounter (Signed)
Dawn Perkins is having neck surgery on October 6th.  They told her she would have some dysphagia afterwards.  [name] is concerned about her asthma.  She wants to know if she has the right inhalers and will everything be ok with her asthma if she has this surgery.  [name] states she would be willing to push the surgery out if she needs time to transition if Dr. Neldon Mc thinks she needs to prepare better for this surgery.  Please advise.

## 2018-12-14 NOTE — Telephone Encounter (Signed)
Please have patient return to clinic for an RV

## 2018-12-22 DIAGNOSIS — N3000 Acute cystitis without hematuria: Secondary | ICD-10-CM | POA: Diagnosis not present

## 2018-12-22 DIAGNOSIS — R8290 Unspecified abnormal findings in urine: Secondary | ICD-10-CM | POA: Diagnosis not present

## 2018-12-23 ENCOUNTER — Ambulatory Visit (INDEPENDENT_AMBULATORY_CARE_PROVIDER_SITE_OTHER): Payer: BC Managed Care – PPO | Admitting: Allergy and Immunology

## 2018-12-23 ENCOUNTER — Other Ambulatory Visit: Payer: Self-pay

## 2018-12-23 VITALS — BP 126/76 | HR 76 | Temp 98.2°F | Resp 16

## 2018-12-23 DIAGNOSIS — J3089 Other allergic rhinitis: Secondary | ICD-10-CM | POA: Diagnosis not present

## 2018-12-23 DIAGNOSIS — J4540 Moderate persistent asthma, uncomplicated: Secondary | ICD-10-CM | POA: Diagnosis not present

## 2018-12-23 DIAGNOSIS — K219 Gastro-esophageal reflux disease without esophagitis: Secondary | ICD-10-CM | POA: Diagnosis not present

## 2018-12-23 DIAGNOSIS — M4802 Spinal stenosis, cervical region: Secondary | ICD-10-CM | POA: Diagnosis not present

## 2018-12-23 NOTE — Patient Instructions (Addendum)
  1.  Continue to Treat and prevent inflammation with the following:   A.  Pulmicort 180 -2 inhalations 1- 2 time per day   B.  OTC Nasacort -1 spray each nostril once a day  2.  Continue to Treat and prevent reflux:   A.  Continue off all forms of caffeine consumption  B.  Famotidine 40 mg twice a day  3.  If needed:   A.  Xopenex HFA 2 puffs every 4-6 hours  B.  OTC antihistamine -Claritin/Zyrtec-1 time per day  4. Return to clinic in 6 months or earlier if problem  5. Obtain fall flu vaccine (and COVID vaccine)

## 2018-12-23 NOTE — Progress Notes (Signed)
[place]   Follow-up Note  Referring Provider: Physicians, [facility]* Primary Provider: Phys[facility], [facility] Date of Office Visit: 12/23/2018  Subjective:   Dawn Perkins (DOB: 09-Oct-1955) is a 63 y.o. female who [facility] in re-evaluation of the following:  HPI: Dawn Hashimotoatricia returns to this clinic in evaluation of asthma and allergic rhinoconjunctivitis and LPR and a history of eosinophilic esophagitis.  Her last visit to this clinic was 03 May 2018.  She has really done well with her asthma.  She has not required a systemic steroid to treat an exacerbation.  She cannot tolerate her Elwin SleightDulera because of palpitations and it does make her cough and she is now using Pulmicort without any problem.  She has done so well she has tapered down her Pulmicort to just about every other day.  Her nose is doing very well while using Nasacort on a pretty consistent basis.  All of her reflux issue is under excellent control and she has no other problems with her throat now that she has completely tapered off caffeine and has been using famotidine at 80 mg daily.  She still has reflux problems while using 40 mg once a day.  There was a situation where she drank a caffeinated drink and she apparently went into atrial fibrillation that has since been evaluated by cardiology with a 30-day monitor which apparently did not identify any new atrial fibrillation activity as long as she remains off caffeine.  She is scheduled to have cervical neck surgery on 04 January 2019.  Allergies as of 12/23/2018      Reactions   Effexor Xr [venlafaxine Hcl]    Telmisartan Shortness Of Breath, Rash   Esomeprazole    Rash   Esomeprazole Magnesium    Rash   Omeprazole    rash   Omeprazole Magnesium    rash   Other    Tape    Venlafaxine    Trouble breathing   Nexium I.v. [esomeprazole Sodium] Rash       Medication List      ALPRAZolam 0.25 MG tablet Commonly known as: XANAX Take 0.25 mg by mouth at bedtime as needed for anxiety.   ALPRAZolam 0.25 MG tablet Commonly known as: Xanax Take 1-2 tabs (0.25mg -0.50mg ) 30-60 minutes before procedure. May repeat if needed.Do not drive.   baclofen 10 MG tablet Commonly known as: LIORESAL Take 10 mg by mouth as needed for muscle spasms.   budesonide 180 MCG/ACT inhaler Commonly known as: Pulmicort Flexhaler INHALE 2 PUFFS INTO THE LUNGS 2 (TWO) TIMES DAILY. RINSE, GARGLE, AND SPIT AFTER USE.   COQ-10 PO Take by mouth daily.   levalbuterol 45 MCG/ACT inhaler Commonly known as: XOPENEX HFA Inhale two puffs every 4-6 hours if needed for cough or wheeze.   MAGNESIUM PO Take by mouth as needed.   mometasone-formoterol 200-5 MCG/ACT Aero Commonly known as: Dulera Inhale 2 puffs into the lungs 2 (two) times daily.   NASACORT ALLERGY 24HR NA Place 1 spray into both nostrils daily.   Pepcid 40 MG tablet Generic drug: famotidine Take 40 mg by mouth 2 (two) times daily.   rosuvastatin 5 MG tablet Commonly known as: CRESTOR Take 5 mg by mouth at bedtime.   TYLENOL PO Take by mouth as needed.   verapamil 240 MG CR tablet Commonly known as: CALAN-SR Take 240 mg by mouth at bedtime.   Vitamin  D 50 MCG (2000 UT) tablet Take 2,000 Units by mouth daily.       Past Medical History:  Diagnosis Date  . Arm pain    left  . Asthma   . Diabetes mellitus without complication (HCC)   . GERD (gastroesophageal reflux disease)   . Head pain   . Hyperlipidemia   . Hypertension   . Neck pain   . Renal insufficiency   . SVT (supraventricular tachycardia) (HCC)     Past Surgical History:  Procedure Laterality Date  . ABDOMINAL HYSTERECTOMY    . APPENDECTOMY    . CHOLECYSTECTOMY    . TONSILLECTOMY      Review of systems negative except as noted in HPI / PMHx or noted below:  Review of Systems  Constitutional: Negative.   HENT:  Negative.   Eyes: Negative.   Respiratory: Negative.   Cardiovascular: Negative.   Gastrointestinal: Negative.   Genitourinary: Negative.   Musculoskeletal: Negative.   Skin: Negative.   Neurological: Negative.   Endo/Heme/Allergies: Negative.   Psychiatric/Behavioral: Negative.      Objective:   Vitals:   12/23/18 1720  BP: 126/76  Pulse: 76  Resp: 16  Temp: 98.2 F (36.8 C)  SpO2: 95%          Physical Exam Constitutional:      Appearance: She is not diaphoretic.  HENT:     Head: Normocephalic.     Right Ear: Tympanic membrane, ear canal and external ear normal.     Left Ear: Tympanic membrane, ear canal and external ear normal.     Nose: Nose normal. No mucosal edema or rhinorrhea.     Mouth/Throat:     Pharynx: Uvula midline. No oropharyngeal exudate.  Eyes:     Conjunctiva/sclera: Conjunctivae normal.  Neck:     Thyroid: No thyromegaly.     Trachea: Trachea normal. No tracheal tenderness or tracheal deviation.  Cardiovascular:     Rate and Rhythm: Normal rate and regular rhythm.     Heart sounds: Normal heart sounds, S1 normal and S2 normal. No murmur.  Pulmonary:     Effort: No respiratory distress.     Breath sounds: Normal breath sounds. No stridor. No wheezing or rales.  Lymphadenopathy:     Head:     Right side of head: No tonsillar adenopathy.     Left side of head: No tonsillar adenopathy.     Cervical: No cervical adenopathy.  Skin:    Findings: No erythema or rash.     Nails: There is no clubbing.   Neurological:     Mental Status: She is alert.     Diagnostics:    Spirometry was performed and demonstrated an FEV1 of 1.40 at 57 % of predicted.   Assessment and Plan:   1. Asthma, moderate persistent, well-controlled   2. Perennial allergic rhinitis   3. LPRD (laryngopharyngeal reflux disease)     1.  Continue to Treat and prevent inflammation with the following:   A.  Pulmicort 180 -2 inhalations 1-2 time per day   B.  OTC  Nasacort -1 spray each nostril once a day  2.  Continue to Treat and prevent reflux:   A.  Continue off all forms of caffeine consumption  B.  Famotidine 40 mg twice a day  3.  If needed:   A.  Xopenex HFA 2 puffs every 4-6 hours  B.  OTC antihistamine -Claritin/Zyrtec-1 time per day  4. Return to clinic in 6  months or earlier if problem  5. Obtain fall flu vaccine (and COVID vaccine)  Overall Aamari appears to be doing okay with her respiratory tract while using anti-inflammatory agents in the form of an inhaled steroid and nasal steroid and appears to be doing quite well with her LPR while using 80 mg of famotidine daily.  She will continue on this plan and I will see her back in this clinic in 6 months or earlier if there is a problem.  Allena Katz, MD Allergy / Immunology [place]

## 2018-12-27 ENCOUNTER — Encounter: Payer: Self-pay | Admitting: Allergy and Immunology

## 2018-12-29 DIAGNOSIS — Z1159 Encounter for screening for other viral diseases: Secondary | ICD-10-CM | POA: Diagnosis not present

## 2019-01-04 DIAGNOSIS — M50223 Other cervical disc displacement at C6-C7 level: Secondary | ICD-10-CM | POA: Diagnosis not present

## 2019-01-04 DIAGNOSIS — M4802 Spinal stenosis, cervical region: Secondary | ICD-10-CM | POA: Diagnosis not present

## 2019-01-04 DIAGNOSIS — M50021 Cervical disc disorder at C4-C5 level with myelopathy: Secondary | ICD-10-CM | POA: Diagnosis not present

## 2019-01-04 DIAGNOSIS — M50221 Other cervical disc displacement at C4-C5 level: Secondary | ICD-10-CM | POA: Diagnosis not present

## 2019-01-04 DIAGNOSIS — M4712 Other spondylosis with myelopathy, cervical region: Secondary | ICD-10-CM | POA: Diagnosis not present

## 2019-01-04 DIAGNOSIS — M50222 Other cervical disc displacement at C5-C6 level: Secondary | ICD-10-CM | POA: Diagnosis not present

## 2019-01-28 DIAGNOSIS — M5412 Radiculopathy, cervical region: Secondary | ICD-10-CM | POA: Diagnosis not present

## 2019-02-10 ENCOUNTER — Other Ambulatory Visit: Payer: Self-pay | Admitting: Allergy and Immunology

## 2019-02-12 DIAGNOSIS — Z23 Encounter for immunization: Secondary | ICD-10-CM | POA: Diagnosis not present

## 2019-03-03 DIAGNOSIS — K118 Other diseases of salivary glands: Secondary | ICD-10-CM | POA: Diagnosis not present

## 2019-03-03 DIAGNOSIS — E1129 Type 2 diabetes mellitus with other diabetic kidney complication: Secondary | ICD-10-CM | POA: Diagnosis not present

## 2019-03-03 DIAGNOSIS — E559 Vitamin D deficiency, unspecified: Secondary | ICD-10-CM | POA: Diagnosis not present

## 2019-03-03 DIAGNOSIS — E785 Hyperlipidemia, unspecified: Secondary | ICD-10-CM | POA: Diagnosis not present

## 2019-03-03 DIAGNOSIS — I10 Essential (primary) hypertension: Secondary | ICD-10-CM | POA: Diagnosis not present

## 2019-03-07 DIAGNOSIS — M50221 Other cervical disc displacement at C4-C5 level: Secondary | ICD-10-CM | POA: Diagnosis not present

## 2019-03-09 DIAGNOSIS — I10 Essential (primary) hypertension: Secondary | ICD-10-CM | POA: Diagnosis not present

## 2019-03-09 DIAGNOSIS — R55 Syncope and collapse: Secondary | ICD-10-CM | POA: Diagnosis not present

## 2019-03-09 DIAGNOSIS — Z6826 Body mass index (BMI) 26.0-26.9, adult: Secondary | ICD-10-CM | POA: Diagnosis not present

## 2019-03-10 DIAGNOSIS — E1122 Type 2 diabetes mellitus with diabetic chronic kidney disease: Secondary | ICD-10-CM | POA: Diagnosis not present

## 2019-03-30 ENCOUNTER — Other Ambulatory Visit: Payer: Self-pay | Admitting: Allergy and Immunology

## 2019-03-30 DIAGNOSIS — Z6826 Body mass index (BMI) 26.0-26.9, adult: Secondary | ICD-10-CM | POA: Diagnosis not present

## 2019-03-30 DIAGNOSIS — I10 Essential (primary) hypertension: Secondary | ICD-10-CM | POA: Diagnosis not present

## 2019-03-30 DIAGNOSIS — R42 Dizziness and giddiness: Secondary | ICD-10-CM | POA: Diagnosis not present

## 2019-05-09 DIAGNOSIS — M19019 Primary osteoarthritis, unspecified shoulder: Secondary | ICD-10-CM | POA: Diagnosis not present

## 2019-05-09 DIAGNOSIS — M542 Cervicalgia: Secondary | ICD-10-CM | POA: Diagnosis not present

## 2019-05-09 DIAGNOSIS — M5412 Radiculopathy, cervical region: Secondary | ICD-10-CM | POA: Diagnosis not present

## 2019-05-09 DIAGNOSIS — I10 Essential (primary) hypertension: Secondary | ICD-10-CM | POA: Diagnosis not present

## 2019-05-10 DIAGNOSIS — F419 Anxiety disorder, unspecified: Secondary | ICD-10-CM | POA: Diagnosis not present

## 2019-05-10 DIAGNOSIS — I10 Essential (primary) hypertension: Secondary | ICD-10-CM | POA: Diagnosis not present

## 2019-05-10 DIAGNOSIS — Z6826 Body mass index (BMI) 26.0-26.9, adult: Secondary | ICD-10-CM | POA: Diagnosis not present

## 2019-05-17 DIAGNOSIS — Z9889 Other specified postprocedural states: Secondary | ICD-10-CM | POA: Diagnosis not present

## 2019-05-17 DIAGNOSIS — R42 Dizziness and giddiness: Secondary | ICD-10-CM | POA: Diagnosis not present

## 2019-05-17 DIAGNOSIS — H8110 Benign paroxysmal vertigo, unspecified ear: Secondary | ICD-10-CM | POA: Diagnosis not present

## 2019-05-22 DIAGNOSIS — R1030 Lower abdominal pain, unspecified: Secondary | ICD-10-CM | POA: Diagnosis not present

## 2019-06-08 ENCOUNTER — Encounter: Payer: Self-pay | Admitting: Cardiology

## 2019-06-08 ENCOUNTER — Telehealth (INDEPENDENT_AMBULATORY_CARE_PROVIDER_SITE_OTHER): Payer: Self-pay | Admitting: Cardiology

## 2019-06-08 VITALS — Ht 64.0 in | Wt 159.0 lb

## 2019-06-08 DIAGNOSIS — E785 Hyperlipidemia, unspecified: Secondary | ICD-10-CM

## 2019-06-08 DIAGNOSIS — R002 Palpitations: Secondary | ICD-10-CM

## 2019-06-08 DIAGNOSIS — R072 Precordial pain: Secondary | ICD-10-CM

## 2019-06-08 DIAGNOSIS — J45909 Unspecified asthma, uncomplicated: Secondary | ICD-10-CM

## 2019-06-08 DIAGNOSIS — E119 Type 2 diabetes mellitus without complications: Secondary | ICD-10-CM

## 2019-06-08 DIAGNOSIS — I10 Essential (primary) hypertension: Secondary | ICD-10-CM

## 2019-06-08 HISTORY — DX: Hyperlipidemia, unspecified: E78.5

## 2019-06-08 HISTORY — DX: Type 2 diabetes mellitus without complications: E11.9

## 2019-06-08 NOTE — Progress Notes (Signed)
Virtual Visit via Video Note   This visit type was conducted due to national recommendations for restrictions regarding the COVID-19 Pandemic (e.g. social distancing) in an effort to limit this patient's exposure and mitigate transmission in our community.  Due to her co-morbid illnesses, this patient is at least at moderate risk for complications without adequate follow up.  This format is felt to be most appropriate for this patient at this time.  All issues noted in this document were discussed and addressed.  A limited physical exam was performed with this format.  Please refer to the patient's chart for her consent to telehealth for [facility].   The patient was identified using 2 identifiers.  Date:  06/08/2019   ID:  Dawn Perkins, DOB 10/30/1955, MRN [id number]  Patient Location: Home Provider Location: Home  PCP:  Physicians, [facility]  Cardiologist:  Dr Jens Som Electrophysiologist:  None   Evaluation Performed:  Follow-Up Visit  Chief Complaint:  occasional palpitations  History of Present Illness:    Dawn Perkins is a pleasant 64 y.o. female with a history of palpitations, PSVT, and near syncope.  She has a family history of coronary disease, her brother is also a patient of Dr. Ludwig Clarks.  The patient had an echo in 2017 that was essentially normal.  She had a negative treadmill in 2018.  Other medical issues include hypertension, diet-controlled diabetes, dyslipidemia, and asthma.  She was contacted today for routine follow-up.  Since Dr. Jens Som saw her last she has had C-spine surgery in October 2020.  She tells me this went well and did not alleviate much of her pain.  She does have occasional palpitations, it does not sounds like she is having sustained tachycardia.  She tells me she has intermittent fatigue. She did have a monitor a year ago in [place] and was told this was okay, I will request those results.   Her primary care provider is  adjusting her medications for hypertension.  She has diet-controlled diabetes with a hemoglobin A1c of 5.8.  She has been unable to take statin therapy.     The patient does not have symptoms concerning for COVID-19 infection (fever, chills, cough, or new shortness of breath).    Past Medical History:  Diagnosis Date  . Arm pain    left  . Asthma   . Diabetes mellitus without complication (HCC)   . GERD (gastroesophageal reflux disease)   . Head pain   . Hyperlipidemia   . Hypertension   . Neck pain   . Renal insufficiency   . SVT (supraventricular tachycardia) (HCC)    Past Surgical History:  Procedure Laterality Date  . ABDOMINAL HYSTERECTOMY    . APPENDECTOMY    . CHOLECYSTECTOMY    . TONSILLECTOMY       Current Meds  Medication Sig  . Acetaminophen (TYLENOL PO) Take by mouth as needed.  . ALPRAZolam (XANAX) 0.25 MG tablet Take 0.25 mg by mouth at bedtime as needed for anxiety.  . budesonide (PULMICORT FLEXHALER) 180 MCG/ACT inhaler INHALE 2 PUFFS INTO THE LUNGS 2 (TWO) TIMES DAILY. RINSE, GARGLE, AND SPIT AFTER USE.  Marland Kitchen Cholecalciferol (VITAMIN D) 2000 units tablet Take 2,000 Units by mouth daily.  . Coenzyme Q10 (COQ-10 PO) Take by mouth daily.  . famotidine (PEPCID) 40 MG tablet Take 40 mg by mouth 2 (two) times daily.  Marland Kitchen levalbuterol (XOPENEX HFA) 45 MCG/ACT inhaler Inhale two puffs every 4-6 hours if needed for cough or wheeze.  Marland Kitchen  MAGNESIUM PO Take by mouth as needed.  . Triamcinolone Acetonide (NASACORT ALLERGY 24HR NA) Place 1 spray into both nostrils daily.  . verapamil (CALAN-SR) 240 MG CR tablet Take 240 mg by mouth at bedtime.  . [DISCONTINUED] ALPRAZolam (XANAX) 0.25 MG tablet Take 1-2 tabs (0.25mg -0.50mg ) 30-60 minutes before procedure. May repeat if needed.Do not drive.     Allergies:   Effexor xr [venlafaxine hcl], Telmisartan, Esomeprazole, Esomeprazole magnesium, Omeprazole, Omeprazole magnesium, Other, Tape, Venlafaxine, and Nexium i.v. [esomeprazole  sodium]   Social History   Tobacco Use  . Smoking status: Never Smoker  . Smokeless tobacco: Never Used  Substance Use Topics  . Alcohol use: No  . Drug use: No     Family Hx: The patient's family history includes Alzheimer's disease in her mother; Asthma in her brother; Diabetes in her brother, brother, father, and mother; Heart disease in her brother, brother, and father; Heart failure in her brother, father, and mother; Hypertension in her brother, father, and mother; Stroke in her mother. There is no history of Neuropathy, Multiple sclerosis, Migraines, or Ataxia.  ROS:   Please see the history of present illness.    All other systems reviewed and are negative.   Prior CV studies:   The following studies were reviewed today: [facility] 07-31-2016 Echo 08/01/15  Labs/Other Tests and Data Reviewed:    EKG:  An ECG dated 12/25/2016 was personally reviewed today and demonstrated:  NSR-HR 88  Recent Labs: 09/23/2018: BUN 12; Creatinine, Ser 0.83; Potassium 4.6; Sodium 145   Recent Lipid Panel No results found for: CHOL, TRIG, HDL, CHOLHDL, LDLCALC, LDLDIRECT  Wt Readings from Last 3 Encounters:  06/08/19 159 lb (72.1 kg)  09/06/18 157 lb (71.2 kg)  05/03/18 165 lb 3.2 oz (74.9 kg)     Objective:    Vital Signs:  Ht 5\' 4"  (1.626 m)   Wt 159 lb (72.1 kg)   BMI 27.29 kg/m    VITAL SIGNS:  reviewed  ASSESSMENT & PLAN:    Palpitations- Intermittent- not severe currently by her history  HTN- B/P controlled with the recent addition of Chlorthalidone 12.5 mg by her PCP.  NIDDM- Diet controlled  Dyslipidemia- Intolerant to Crestor in the past, she is considering trying a twice a week dose.  FM Hx- Brother died 90 with CAD, one brother followed by Dr Stanford Breed  Asthma- On inhlaers  Plan: I'll request monitor results from her PCP. F/U Dr Stanford Breed one year. Exercise and trial of twice a week statin Rx recommended.  COVID-19 Education: The signs and symptoms of COVID-19 were  discussed with the patient and how to seek care for testing (follow up with PCP or arrange E-visit).  The importance of social distancing was discussed today.  Time:   Today, I have spent 20 minutes with the patient with telehealth technology discussing the above problems.     Medication Adjustments/Labs and Tests Ordered: Current medicines are reviewed at length with the patient today.  Concerns regarding medicines are outlined above.   Tests Ordered: No orders of the defined types were placed in this encounter.   Medication Changes: No orders of the defined types were placed in this encounter.   Follow Up:  In Person One year with Dr Stanford Breed  Signed, Kerin Ransom, PA-C  06/08/2019 8:35 AM    [place]

## 2019-06-08 NOTE — Patient Instructions (Signed)
Medication Instructions:  No Changes *If you need a refill on your cardiac medications before your next appointment, please call your pharmacy*   Lab Work: None  Testing/Procedures: None  Follow-Up: At [facility], you and your health needs are our priority.  As part of our continuing mission to provide you with exceptional heart care, we have created designated Provider Care Teams.  These Care Teams include your primary Cardiologist (physician) and Advanced Practice Providers (APPs -  Physician Assistants and Nurse Practitioners) who all work together to provide you with the care you need, when you need it.  We recommend signing up for the patient portal called "MyChart".  Sign up information is provided on this After Visit Summary.  MyChart is used to connect with patients for Virtual Visits (Telemedicine).  Patients are able to view lab/test results, encounter notes, upcoming appointments, etc.  Non-urgent messages can be sent to your provider as well.   To learn more about what you can do with MyChart, go to [email or web address].    Your next appointment:   1 year  You will receive a reminder letter in the mail two months in advance. If you don't receive a letter, please call our office to schedule the follow-up appointment.  The format for your next appointment:   In Person  Provider:   Olga Millers, MD

## 2019-06-22 ENCOUNTER — Encounter: Payer: Self-pay | Admitting: Allergy and Immunology

## 2019-06-22 ENCOUNTER — Ambulatory Visit (INDEPENDENT_AMBULATORY_CARE_PROVIDER_SITE_OTHER): Payer: BC Managed Care – PPO | Admitting: Allergy and Immunology

## 2019-06-22 ENCOUNTER — Other Ambulatory Visit: Payer: Self-pay

## 2019-06-22 VITALS — BP 150/88 | HR 86 | Temp 98.3°F | Resp 18 | Ht 63.0 in | Wt 163.0 lb

## 2019-06-22 DIAGNOSIS — J3089 Other allergic rhinitis: Secondary | ICD-10-CM

## 2019-06-22 DIAGNOSIS — K219 Gastro-esophageal reflux disease without esophagitis: Secondary | ICD-10-CM

## 2019-06-22 DIAGNOSIS — J4540 Moderate persistent asthma, uncomplicated: Secondary | ICD-10-CM | POA: Diagnosis not present

## 2019-06-22 NOTE — Progress Notes (Signed)
[facility]   Follow-up Note  Referring Provider: [facility], [place] Primary Provider: Elenore Paddy, NP Date of Office Visit: 06/22/2019  Subjective:   Dawn Perkins (DOB: Sep 05, 1955) is a 64 y.o. female who returns to the [facility] on 06/22/2019 in re-evaluation of the following:  HPI: Dawn Perkins returns to this clinic in reevaluation of asthma and allergic rhinoconjunctivitis and LPR and a history of eosinophilic esophagitis.  Her last visit to this clinic was 23 December 2018.  Overall she has done very well with her airway and has not required a systemic steroid or an antibiotic for any type of airway issue.  She rarely uses a short acting bronchodilator while she continues on Pulmicort 2 inhalations 1 time per day.  Likewise her nose has really been doing well while using Nasacort every day.  Her reflux is under very good control using famotidine at 40 mg twice a day.  She is completely off all forms of caffeine at this point secondary to her reflux and also secondary to her history of atrial fibrillation.  She did obtain the flu vaccine this year season and she has received her first [place] Covid vaccine.  Allergies as of 06/22/2019      Reactions   Effexor Xr [venlafaxine Hcl]    Telmisartan Shortness Of Breath, Rash   Esomeprazole    Rash   Esomeprazole Magnesium    Rash   Omeprazole    rash   Omeprazole Magnesium    rash   Other    Tape    Venlafaxine    Trouble breathing   Nexium I.v. [esomeprazole Sodium] Rash      Medication List    ALPRAZolam 0.25 MG tablet Commonly known as: XANAX Take 0.25 mg by mouth at bedtime as needed for anxiety.   CHLORTHALIDONE PO Take by mouth.   COQ-10 PO Take by mouth daily.   levalbuterol 45 MCG/ACT inhaler Commonly known as: XOPENEX HFA Inhale two puffs every 4-6 hours if needed for cough or wheeze.   MAGNESIUM PO Take by mouth as needed.     NASACORT ALLERGY 24HR NA Place 1 spray into both nostrils daily.   Pepcid 40 MG tablet Generic drug: famotidine Take 40 mg by mouth 2 (two) times daily.   Pulmicort Flexhaler 180 MCG/ACT inhaler Generic drug: budesonide INHALE 2 PUFFS INTO THE LUNGS 2 (TWO) TIMES DAILY. RINSE, GARGLE, AND SPIT AFTER USE.   rosuvastatin 5 MG tablet Commonly known as: CRESTOR Take 5 mg by mouth at bedtime.   TYLENOL PO Take by mouth as needed.   verapamil 240 MG CR tablet Commonly known as: CALAN-SR Take 240 mg by mouth at bedtime.   Vitamin D 50 MCG (2000 UT) tablet Take 2,000 Units by mouth daily.       Past Medical History:  Diagnosis Date  . Arm pain    left  . Asthma   . Diabetes mellitus without complication ([place])   . GERD (gastroesophageal reflux disease)   . Head pain   . Hyperlipidemia   . Hypertension   . Neck pain   . Renal insufficiency   . SVT (supraventricular tachycardia) ([facility])     Past Surgical History:  Procedure Laterality Date  . ABDOMINAL HYSTERECTOMY    . APPENDECTOMY    . CHOLECYSTECTOMY    . TONSILLECTOMY      Review of systems negative except as noted in HPI / PMHx  or noted below:  Review of Systems  Constitutional: Negative.   HENT: Negative.   Eyes: Negative.   Respiratory: Negative.   Cardiovascular: Negative.   Gastrointestinal: Negative.   Genitourinary: Negative.   Musculoskeletal: Negative.   Skin: Negative.   Neurological: Negative.   Endo/Heme/Allergies: Negative.   Psychiatric/Behavioral: Negative.      Objective:   Vitals:   06/22/19 1643  BP: (!) 150/88  Pulse: 86  Resp: 18  Temp: 98.3 F (36.8 C)  SpO2: 94%   Height: 5\' 3"  (160 cm)  Weight: 163 lb (73.9 kg)   Physical Exam Constitutional:      Appearance: She is not diaphoretic.  HENT:     Head: Normocephalic.     Right Ear: Tympanic membrane, ear canal and external ear normal.     Left Ear: Tympanic membrane, ear canal and external ear normal.     Nose:  Nose normal. No mucosal edema or rhinorrhea.     Mouth/Throat:     Pharynx: Uvula midline. No oropharyngeal exudate.  Eyes:     Conjunctiva/sclera: Conjunctivae normal.  Neck:     Thyroid: No thyromegaly.     Trachea: Trachea normal. No tracheal tenderness or tracheal deviation.  Cardiovascular:     Rate and Rhythm: Normal rate and regular rhythm.     Heart sounds: Normal heart sounds, S1 normal and S2 normal. No murmur.  Pulmonary:     Effort: No respiratory distress.     Breath sounds: Normal breath sounds. No stridor. No wheezing or rales.  Lymphadenopathy:     Head:     Right side of head: No tonsillar adenopathy.     Left side of head: No tonsillar adenopathy.     Cervical: No cervical adenopathy.  Skin:    Findings: No erythema or rash.     Nails: There is no clubbing.  Neurological:     Mental Status: She is alert.     Diagnostics:    Spirometry was performed and demonstrated an FEV1 of 1.45 at 62 % of predicted.  The patient had an Asthma Control Test with the following results: ACT Total Score: 22.    Assessment and Plan:   1. Asthma, moderate persistent, well-controlled   2. Perennial allergic rhinitis   3. LPRD (laryngopharyngeal reflux disease)     1.  Continue to Treat and prevent inflammation with the following:   A.  Pulmicort 180 -2 inhalations 1- 2 times per day   B.  OTC Nasacort -1 spray each nostril 1-2 times a day  2.  Continue to Treat and prevent reflux:   A.  Continue off all forms of caffeine consumption  B.  Famotidine 40 mg twice a day  3.  If needed:   A.  Xopenex HFA 2 puffs every 4-6 hours  B.  OTC antihistamine -Claritin/Zyrtec-1 time per day  4. Return to clinic in 6 months or earlier if problem  5.  Engage in progressive aerobic exercise program  Dawn Perkins appears to be doing very well on her current plan of anti-inflammatory agents for her airway and therapy directed against reflux as specified above.  I did make a suggestion  to her that she engage in a progressive aerobic exercise program as she really does no exercise at all.  I told her that becoming aerobically conditioned will help her as she enters into the seventh and eighth and ninth decade of her life.  Assuming she does well I will see her back in  this clinic in 6 months or earlier if there is a problem.  Allena Katz, MD Allergy / Immunology [place]

## 2019-06-22 NOTE — Patient Instructions (Addendum)
  1.  Continue to Treat and prevent inflammation with the following:   A.  Pulmicort 180 -2 inhalations 1- 2 times per day   B.  OTC Nasacort -1 spray each nostril 1-2 times a day  2.  Continue to Treat and prevent reflux:   A.  Continue off all forms of caffeine consumption  B.  Famotidine 40 mg twice a day  3.  If needed:   A.  Xopenex HFA 2 puffs every 4-6 hours  B.  OTC antihistamine -Claritin/Zyrtec-1 time per day  4. Return to clinic in 6 months or earlier if problem  5.  Engage in progressive aerobic exercise program

## 2019-06-23 ENCOUNTER — Encounter: Payer: Self-pay | Admitting: Allergy and Immunology

## 2019-07-14 ENCOUNTER — Telehealth: Payer: Self-pay | Admitting: Allergy and Immunology

## 2019-07-14 NOTE — Telephone Encounter (Signed)
Patient called and states she got her COVID vaccine last Thursday and noticed redness on her arm 2 days ago, warm to touch but did not itch. Patient states yesterday she noticed whelps on her arm and took benadryl and applied Cortizone. Patient's legs are itching which occurred when she noticed the redness on her arm. She states she is a litter better after the benadryl and Cortizone but wanted to know if she should be concerned or take any precautions.  Patient will be in class until 4, and gave verbal consent to leave a message on her cell phone and she will call back.  Please advise.

## 2019-07-14 NOTE — Telephone Encounter (Signed)
Please inform patient that she is having immune activation from her vaccine.  She can use cetirizine 10 mg - 1 tablet 1-2 times per day.  This is not a particularly bad thing to occur as it does mean her immune system is recognizing her vaccine.

## 2019-07-14 NOTE — Telephone Encounter (Signed)
Per pts request, I left Dr. Kathyrn Lass instructions on her voicemail.

## 2019-07-14 NOTE — Telephone Encounter (Signed)
Please advise 

## 2019-08-01 DIAGNOSIS — D123 Benign neoplasm of transverse colon: Secondary | ICD-10-CM | POA: Diagnosis not present

## 2019-08-01 DIAGNOSIS — Z1211 Encounter for screening for malignant neoplasm of colon: Secondary | ICD-10-CM | POA: Diagnosis not present

## 2019-08-01 DIAGNOSIS — K5730 Diverticulosis of large intestine without perforation or abscess without bleeding: Secondary | ICD-10-CM | POA: Diagnosis not present

## 2019-08-01 DIAGNOSIS — Z86010 Personal history of colonic polyps: Secondary | ICD-10-CM | POA: Diagnosis not present

## 2019-08-01 DIAGNOSIS — K635 Polyp of colon: Secondary | ICD-10-CM | POA: Diagnosis not present

## 2019-08-01 DIAGNOSIS — D125 Benign neoplasm of sigmoid colon: Secondary | ICD-10-CM | POA: Diagnosis not present

## 2019-08-17 DIAGNOSIS — F419 Anxiety disorder, unspecified: Secondary | ICD-10-CM | POA: Diagnosis not present

## 2019-08-17 DIAGNOSIS — I10 Essential (primary) hypertension: Secondary | ICD-10-CM | POA: Diagnosis not present

## 2019-08-17 DIAGNOSIS — M25551 Pain in right hip: Secondary | ICD-10-CM | POA: Diagnosis not present

## 2019-08-17 DIAGNOSIS — Z6826 Body mass index (BMI) 26.0-26.9, adult: Secondary | ICD-10-CM | POA: Diagnosis not present

## 2019-09-20 DIAGNOSIS — K200 Eosinophilic esophagitis: Secondary | ICD-10-CM | POA: Diagnosis not present

## 2019-09-20 DIAGNOSIS — I10 Essential (primary) hypertension: Secondary | ICD-10-CM | POA: Diagnosis not present

## 2019-09-20 DIAGNOSIS — K2900 Acute gastritis without bleeding: Secondary | ICD-10-CM | POA: Diagnosis not present

## 2019-09-20 DIAGNOSIS — F419 Anxiety disorder, unspecified: Secondary | ICD-10-CM | POA: Diagnosis not present

## 2019-09-20 DIAGNOSIS — R0789 Other chest pain: Secondary | ICD-10-CM | POA: Diagnosis not present

## 2019-10-11 DIAGNOSIS — Z131 Encounter for screening for diabetes mellitus: Secondary | ICD-10-CM | POA: Diagnosis not present

## 2019-10-11 DIAGNOSIS — E1129 Type 2 diabetes mellitus with other diabetic kidney complication: Secondary | ICD-10-CM | POA: Diagnosis not present

## 2019-10-11 DIAGNOSIS — Z0000 Encounter for general adult medical examination without abnormal findings: Secondary | ICD-10-CM | POA: Diagnosis not present

## 2019-10-11 DIAGNOSIS — Z780 Asymptomatic menopausal state: Secondary | ICD-10-CM | POA: Diagnosis not present

## 2019-10-11 DIAGNOSIS — Z1331 Encounter for screening for depression: Secondary | ICD-10-CM | POA: Diagnosis not present

## 2019-10-11 DIAGNOSIS — Z13220 Encounter for screening for lipoid disorders: Secondary | ICD-10-CM | POA: Diagnosis not present

## 2019-10-13 ENCOUNTER — Encounter: Payer: Self-pay | Admitting: Cardiology

## 2019-10-13 ENCOUNTER — Other Ambulatory Visit: Payer: Self-pay

## 2019-10-13 ENCOUNTER — Ambulatory Visit: Payer: BC Managed Care – PPO | Admitting: Cardiology

## 2019-10-13 VITALS — BP 132/74 | HR 78 | Ht 64.0 in | Wt 162.2 lb

## 2019-10-13 DIAGNOSIS — I471 Supraventricular tachycardia: Secondary | ICD-10-CM

## 2019-10-13 DIAGNOSIS — K200 Eosinophilic esophagitis: Secondary | ICD-10-CM | POA: Insufficient documentation

## 2019-10-13 DIAGNOSIS — R002 Palpitations: Secondary | ICD-10-CM

## 2019-10-13 DIAGNOSIS — I10 Essential (primary) hypertension: Secondary | ICD-10-CM | POA: Diagnosis not present

## 2019-10-13 DIAGNOSIS — J45909 Unspecified asthma, uncomplicated: Secondary | ICD-10-CM

## 2019-10-13 DIAGNOSIS — E785 Hyperlipidemia, unspecified: Secondary | ICD-10-CM | POA: Diagnosis not present

## 2019-10-13 DIAGNOSIS — R079 Chest pain, unspecified: Secondary | ICD-10-CM

## 2019-10-13 DIAGNOSIS — R0989 Other specified symptoms and signs involving the circulatory and respiratory systems: Secondary | ICD-10-CM

## 2019-10-13 HISTORY — DX: Eosinophilic esophagitis: K20.0

## 2019-10-13 HISTORY — DX: Other specified symptoms and signs involving the circulatory and respiratory systems: R09.89

## 2019-10-13 NOTE — Progress Notes (Signed)
Cardiology Office Note:    Date:  10/13/2019   ID:  LUZ MARES, DOB 1955-07-25, MRN [id number]  PCP:  Julianne Handler, NP  Cardiologist:  Olga Millers, MD  Electrophysiologist:  None   Referring MD: Julianne Handler, NP   CC:  Chest pain  History of Present Illness:    Dawn Perkins is a 64 y.o. female with a hx of palpitations and PSVT, dyslipidemia, family history of coronary disease, asthma, and esophagitis.  She has had a prior negative treadmill and essentially normal echo in 2018.  She presents to the office today with complaints of epigastric pain and pressure.  This happened about 2 weeks ago.  She says it was fairly constant, waxing and waning for couple days.  She thought it might be her esophagitis.  She went to see Dr. Charm Barges in [facility] her gastroenterologist.  The patient has an allergy to PPIs.  She is on Pepcid.  After evaluation by Dr. Charm Barges he was concerned her symptoms may be cardiac.  He suggested cardiology evaluation.  As above the patient describes her symptoms as a midsternal pressure, she gives a Levine sign when describing this.  She does say she has some shortness of breath with it and nausea.  She denies diaphoresis.  She had some discomfort in her left shoulder as well.  Her symptoms were not particularly exertional although she admits she does not do much.  She says she has a high stress job and works from home.  Dr. Charm Barges did give her samples of nitroglycerin.  She took a nitroglycerin in his office and her symptoms seem to improve.  She is not particularly had chest pain since.  Past Medical History:  Diagnosis Date  . Arm pain    left  . Asthma   . Diabetes mellitus without complication ([facility])   . GERD (gastroesophageal reflux disease)   . Head pain   . Hyperlipidemia   . Hypertension   . Neck pain   . Renal insufficiency   . SVT (supraventricular tachycardia) ([facility])     Past Surgical History:  Procedure Laterality Date  . ABDOMINAL  HYSTERECTOMY    . APPENDECTOMY    . CHOLECYSTECTOMY    . TONSILLECTOMY      Current Medications: Current Meds  Medication Sig  . Acetaminophen (TYLENOL PO) Take by mouth as needed.  . ALPRAZolam (XANAX) 0.25 MG tablet Take 0.25 mg by mouth at bedtime as needed for anxiety.  Marland Kitchen aspirin EC 81 MG tablet Take 81 mg by mouth daily. Swallow whole.  . budesonide (PULMICORT FLEXHALER) 180 MCG/ACT inhaler INHALE 2 PUFFS INTO THE LUNGS 2 (TWO) TIMES DAILY. RINSE, GARGLE, AND SPIT AFTER USE.  Marland Kitchen CHLORTHALIDONE PO Take by mouth.  . Cholecalciferol (VITAMIN D) 2000 units tablet Take 2,000 Units by mouth daily.  . Coenzyme Q10 (COQ-10 PO) Take by mouth daily.  . famotidine (PEPCID) 40 MG tablet Take 40 mg by mouth 2 (two) times daily.  Marland Kitchen levalbuterol (XOPENEX HFA) 45 MCG/ACT inhaler Inhale two puffs every 4-6 hours if needed for cough or wheeze.  Marland Kitchen MAGNESIUM PO Take by mouth as needed.  . nitroGLYCERIN (NITROSTAT) 0.4 MG SL tablet Place 0.4 mg under the tongue every 5 (five) minutes as needed for chest pain.  . rosuvastatin (CRESTOR) 5 MG tablet Take 2.5 mg by mouth daily. Take 0.5 tablet daily  . Triamcinolone Acetonide (NASACORT ALLERGY 24HR NA) Place 1 spray into both nostrils daily.  . verapamil (CALAN-SR) 240  MG CR tablet Take 240 mg by mouth at bedtime.  . [DISCONTINUED] rosuvastatin (CRESTOR) 5 MG tablet Take 5 mg by mouth at bedtime.     Allergies:   Effexor xr [venlafaxine hcl], Telmisartan, Esomeprazole, Esomeprazole magnesium, Omeprazole, Omeprazole magnesium, Other, Tape, Venlafaxine, and Nexium i.v. [esomeprazole sodium]   Social History   Socioeconomic History  . Marital status: Married    Spouse name: Rayna Sexton  . Number of children: 2  . Years of education: 12+  . Highest education level: Not on file  Occupational History  . Occupation: [employer] services  Tobacco Use  . Smoking status: Never Smoker  . Smokeless tobacco: Never Used  Vaping Use  . Vaping Use: Never used   Substance and Sexual Activity  . Alcohol use: No  . Drug use: No  . Sexual activity: Not on file  Other Topics Concern  . Not on file  Social History Narrative   Lives with spouse   Caffeine use:  caf free diet sodas 1-1.5 daily   Social Determinants of Health   Financial Resource Strain:   . Difficulty of Paying Living Expenses:   Food Insecurity:   . Worried About Programme researcher, broadcasting/film/video in the Last Year:   . Barista in the Last Year:   Transportation Needs:   . Freight forwarder (Medical):   Marland Kitchen Lack of Transportation (Non-Medical):   Physical Activity:   . Days of Exercise per Week:   . Minutes of Exercise per Session:   Stress:   . Feeling of Stress :   Social Connections:   . Frequency of Communication with Friends and Family:   . Frequency of Social Gatherings with Friends and Family:   . Attends Religious Services:   . Active Member of Clubs or Organizations:   . Attends Banker Meetings:   Marland Kitchen Marital Status:      Family History: The patient's family history includes Alzheimer's disease in her mother; Asthma in her brother; Diabetes in her brother, brother, father, and mother; Heart disease in her brother, brother, and father; Heart failure in her brother, father, and mother; Hypertension in her brother, father, and mother; Stroke in her mother. There is no history of Neuropathy, Multiple sclerosis, Migraines, or Ataxia.  ROS:   Please see the history of present illness.     All other systems reviewed and are negative.  EKGs/Labs/Other Studies Reviewed:    The following studies were reviewed today: GXT and echo 2018  EKG:  EKG is ordered today.  The ekg ordered today demonstrates NSR, HR 78  Recent Labs: No results found for requested labs within last 8760 hours.  Recent Lipid Panel No results found for: CHOL, TRIG, HDL, CHOLHDL, VLDL, LDLCALC, LDLDIRECT  Physical Exam:    VS:  BP 132/74   Pulse 78   Ht 5\' 4"  (1.626 m)   Wt 162 lb  3.2 oz (73.6 kg)   SpO2 99%   BMI 27.84 kg/m     Wt Readings from Last 3 Encounters:  10/13/19 162 lb 3.2 oz (73.6 kg)  06/22/19 163 lb (73.9 kg)  06/08/19 159 lb (72.1 kg)     GEN: Well nourished, well developed in no acute distress HEENT: Normal NECK: No JVD; Rt carotid bruit CARDIAC: RRR, no murmurs, rubs, gallops RESPIRATORY:  Clear to auscultation without rales, wheezing or rhonchi  ABDOMEN: Soft, non-tender, non-distended MUSCULOSKELETAL:  No edema; No deformity  SKIN: Warm and dry NEUROLOGIC:  Alert and oriented x 3 PSYCHIATRIC:  Normal affect   ASSESSMENT:    Chest pain with moderate risk for cardiac etiology Negative GXT 2018, normal echo 2017. Her symptoms are not classic but she does have multiple risk factors.  Will proceed with Lexiscan Myoview  Right carotid bruit Check dopplers  Essential hypertension Controlled  Dyslipidemia Tolerating Crestor 2.5 mg twice a week.  Esophagitis, eosinophilic Dr Charm Barges follows- she is allergic to PPIs- rash  Asthma Will avoid beta blocker for now.   PLAN:    Lexiscan Myoview.  She started ASA 81 mg a couple of weeks ago on her own.  She has NTG prescribed by Dr Charm Barges.   Check carotid dopplers-Rt CA bruit.   Medication Adjustments/Labs and Tests Ordered: Current medicines are reviewed at length with the patient today.  Concerns regarding medicines are outlined above.  Orders Placed This Encounter  Procedures  . MYOCARDIAL PERFUSION IMAGING  . VAS US CAROTID   No orders of the defined types were placed in this encounter.   Patient Instructions  Medication Instructions:  Your physician recommends that you continue on your current medications as directed. Please refer to the Current Medication list given to you today.  *If you need a refill on your cardiac medications before your next appointment, please call your pharmacy*    Testing/Procedures: Your physician has requested that you have a carotid  duplex. This test is an ultrasound of the carotid arteries in your neck. It looks at blood flow through these arteries that supply the brain with blood. Allow one hour for this exam. There are no restrictions or special instructions.  Your physician has requested that you have a lexiscan myoview. For further information please visit [email or web address] Please follow instruction sheet, as given.   Follow-Up: At [facility], you and your health needs are our priority.  As part of our continuing mission to provide you with exceptional heart care, we have created designated Provider Care Teams.  These Care Teams include your primary Cardiologist (physician) and Advanced Practice Providers (APPs -  Physician Assistants and Nurse Practitioners) who all work together to provide you with the care you need, when you need it.  We recommend signing up for the patient portal called "MyChart".  Sign up information is provided on this After Visit Summary.  MyChart is used to connect with patients for Virtual Visits (Telemedicine).  Patients are able to view lab/test results, encounter notes, upcoming appointments, etc.  Non-urgent messages can be sent to your provider as well.   To learn more about what you can do with MyChart, go to [email or web address].    Your next appointment:   4 week(s)  The format for your next appointment:   Virtual Visit   Provider:   Corine Shelter, PA-C       Signed, Corine Shelter, PA-C  10/13/2019 3:15 PM    [facility]

## 2019-10-13 NOTE — Assessment & Plan Note (Signed)
Tolerating Crestor 2.5 mg twice a week.

## 2019-10-13 NOTE — Assessment & Plan Note (Signed)
Check dopplers 

## 2019-10-13 NOTE — Assessment & Plan Note (Signed)
Dr Charm Barges follows- she is allergic to PPIs- rash

## 2019-10-13 NOTE — Assessment & Plan Note (Signed)
Will avoid beta blocker for now.

## 2019-10-13 NOTE — Patient Instructions (Signed)
Medication Instructions:  Your physician recommends that you continue on your current medications as directed. Please refer to the Current Medication list given to you today.  *If you need a refill on your cardiac medications before your next appointment, please call your pharmacy*    Testing/Procedures: Your physician has requested that you have a carotid duplex. This test is an ultrasound of the carotid arteries in your neck. It looks at blood flow through these arteries that supply the brain with blood. Allow one hour for this exam. There are no restrictions or special instructions.  Your physician has requested that you have a lexiscan myoview. For further information please visit [email or web address] Please follow instruction sheet, as given.   Follow-Up: At [facility], you and your health needs are our priority.  As part of our continuing mission to provide you with exceptional heart care, we have created designated Provider Care Teams.  These Care Teams include your primary Cardiologist (physician) and Advanced Practice Providers (APPs -  Physician Assistants and Nurse Practitioners) who all work together to provide you with the care you need, when you need it.  We recommend signing up for the patient portal called "MyChart".  Sign up information is provided on this After Visit Summary.  MyChart is used to connect with patients for Virtual Visits (Telemedicine).  Patients are able to view lab/test results, encounter notes, upcoming appointments, etc.  Non-urgent messages can be sent to your provider as well.   To learn more about what you can do with MyChart, go to [email or web address].    Your next appointment:   4 week(s)  The format for your next appointment:   Virtual Visit   Provider:   Corine Shelter, PA-C

## 2019-10-13 NOTE — Assessment & Plan Note (Signed)
Negative GXT 2018, normal echo 2017. Her symptoms are not classic but she does have multiple risk factors.  Will proceed with [facility]

## 2019-10-13 NOTE — Assessment & Plan Note (Signed)
Controlled.  

## 2019-10-19 NOTE — Addendum Note (Signed)
Addended by: Orlene Och on: 10/19/2019 04:11 PM   Modules accepted: Orders

## 2019-11-15 ENCOUNTER — Telehealth (HOSPITAL_COMMUNITY): Payer: Self-pay

## 2019-11-15 NOTE — Telephone Encounter (Signed)
Encounter complete. 

## 2019-11-16 ENCOUNTER — Ambulatory Visit (HOSPITAL_BASED_OUTPATIENT_CLINIC_OR_DEPARTMENT_OTHER)
Admission: RE | Admit: 2019-11-16 | Discharge: 2019-11-16 | Disposition: A | Discharge status: Home / Self Care | Payer: BC Managed Care – PPO | Source: Ambulatory Visit | Attending: Cardiology | Admitting: Cardiology

## 2019-11-16 ENCOUNTER — Ambulatory Visit (HOSPITAL_COMMUNITY)
Admission: RE | Admit: 2019-11-16 | Discharge: 2019-11-16 | Disposition: A | Discharge status: Home / Self Care | Payer: BC Managed Care – PPO | Source: Ambulatory Visit | Attending: Cardiology | Admitting: Cardiology

## 2019-11-16 ENCOUNTER — Other Ambulatory Visit: Payer: Self-pay

## 2019-11-16 DIAGNOSIS — R002 Palpitations: Secondary | ICD-10-CM

## 2019-11-16 DIAGNOSIS — R079 Chest pain, unspecified: Secondary | ICD-10-CM | POA: Diagnosis not present

## 2019-11-16 DIAGNOSIS — I471 Supraventricular tachycardia: Secondary | ICD-10-CM | POA: Diagnosis not present

## 2019-11-16 DIAGNOSIS — R0989 Other specified symptoms and signs involving the circulatory and respiratory systems: Secondary | ICD-10-CM

## 2019-11-16 LAB — MYOCARDIAL PERFUSION IMAGING
LV dias vol: 43 mL (ref 46–106)
LV sys vol: 13 mL
Peak HR: 104 {beats}/min
Rest HR: 71 {beats}/min
SDS: 0
SRS: 6
SSS: 6
TID: 1.04

## 2019-11-16 MED ORDER — AMINOPHYLLINE 25 MG/ML IV SOLN
75.0000 mg | Freq: Once | INTRAVENOUS | Status: AC
Start: 2019-11-16 — End: 2019-11-16
  Administered 2019-11-16: 75 mg via INTRAVENOUS

## 2019-11-16 MED ORDER — TECHNETIUM TC 99M TETROFOSMIN IV KIT
31.0000 | PACK | Freq: Once | INTRAVENOUS | Status: AC | PRN
  Administered 2019-11-16: 31 via INTRAVENOUS
  Filled 2019-11-16: qty 31

## 2019-11-16 MED ORDER — REGADENOSON 0.4 MG/5ML IV SOLN
0.4000 mg | Freq: Once | INTRAVENOUS | Status: AC
Start: 2019-11-16 — End: 2019-11-16
  Administered 2019-11-16: 0.4 mg via INTRAVENOUS

## 2019-11-16 MED ORDER — TECHNETIUM TC 99M TETROFOSMIN IV KIT
9.6000 | PACK | Freq: Once | INTRAVENOUS | Status: AC | PRN
  Administered 2019-11-16: 9.6 via INTRAVENOUS
  Filled 2019-11-16: qty 10

## 2019-11-24 ENCOUNTER — Telehealth: Payer: Self-pay

## 2019-11-24 ENCOUNTER — Encounter: Payer: Self-pay | Admitting: Cardiology

## 2019-11-24 ENCOUNTER — Telehealth (INDEPENDENT_AMBULATORY_CARE_PROVIDER_SITE_OTHER): Payer: BC Managed Care – PPO | Admitting: Cardiology

## 2019-11-24 VITALS — Ht 64.0 in | Wt 159.0 lb

## 2019-11-24 DIAGNOSIS — R0989 Other specified symptoms and signs involving the circulatory and respiratory systems: Secondary | ICD-10-CM | POA: Diagnosis not present

## 2019-11-24 DIAGNOSIS — R079 Chest pain, unspecified: Secondary | ICD-10-CM | POA: Diagnosis not present

## 2019-11-24 DIAGNOSIS — I10 Essential (primary) hypertension: Secondary | ICD-10-CM

## 2019-11-24 DIAGNOSIS — E785 Hyperlipidemia, unspecified: Secondary | ICD-10-CM | POA: Diagnosis not present

## 2019-11-24 NOTE — Patient Instructions (Signed)
Medication Instructions:  Your physician recommends that you continue on your current medications as directed. Please refer to the Current Medication list given to you today.  *If you need a refill on your cardiac medications before your next appointment, please call your pharmacy*  Lab Work: NONE ordered at this time of appointment   If you have labs (blood work) drawn today and your tests are completely normal, you will receive your results only by: MyChart Message (if you have MyChart) OR A paper copy in the mail If you have any lab test that is abnormal or we need to change your treatment, we will call you to review the results.  Testing/Procedures: NONE ordered at this time of appointment   Follow-Up: At [facility], you and your health needs are our priority.  As part of our continuing mission to provide you with exceptional heart care, we have created designated Provider Care Teams.  These Care Teams include your primary Cardiologist (physician) and Advanced Practice Providers (APPs -  Physician Assistants and Nurse Practitioners) who all work together to provide you with the care you need, when you need it.  Your next appointment:   1 year(s)  The format for your next appointment:   In Person  Provider:   Brian Crenshaw, MD  Other Instructions    

## 2019-11-24 NOTE — Telephone Encounter (Signed)
  Patient Consent for Virtual Visit         [name] has provided verbal consent on 11/24/2019 for a virtual visit (video or telephone).   CONSENT FOR VIRTUAL VISIT FOR:  Dawn Perkins  By participating in this virtual visit I agree to the following:  I hereby voluntarily request, consent and authorize [facility] and its employed or contracted physicians, physician assistants, nurse practitioners or other licensed health care professionals (the Practitioner), to provide me with telemedicine health care services (the "Services") as deemed necessary by the treating Practitioner. I acknowledge and consent to receive the Services by the Practitioner via telemedicine. I understand that the telemedicine visit will involve communicating with the Practitioner through live audiovisual communication technology and the disclosure of certain medical information by electronic transmission. I acknowledge that I have been given the opportunity to request an in-person assessment or other available alternative prior to the telemedicine visit and am voluntarily participating in the telemedicine visit.  I understand that I have the right to withhold or withdraw my consent to the use of telemedicine in the course of my care at any time, without affecting my right to future care or treatment, and that the Practitioner or I may terminate the telemedicine visit at any time. I understand that I have the right to inspect all information obtained and/or recorded in the course of the telemedicine visit and may receive copies of available information for a reasonable fee.  I understand that some of the potential risks of receiving the Services via telemedicine include:  Marland Kitchen Delay or interruption in medical evaluation due to technological equipment failure or disruption; . Information transmitted may not be sufficient (e.g. poor resolution of images) to allow for appropriate medical decision making by the  Practitioner; and/or  . In rare instances, security protocols could fail, causing a breach of personal health information.  Furthermore, I acknowledge that it is my responsibility to provide information about my medical history, conditions and care that is complete and accurate to the best of my ability. I acknowledge that Practitioner's advice, recommendations, and/or decision may be based on factors not within their control, such as incomplete or inaccurate data provided by me or distortions of diagnostic images or specimens that may result from electronic transmissions. I understand that the practice of medicine is not an exact science and that Practitioner makes no warranties or guarantees regarding treatment outcomes. I acknowledge that a copy of this consent can be made available to me via my patient portal [facility] MyChart), or I can request a printed copy by calling the office of [facility].    I understand that my insurance will be billed for this visit.   I have read or had this consent read to me. . I understand the contents of this consent, which adequately explains the benefits and risks of the Services being provided via telemedicine.  . I have been provided ample opportunity to ask questions regarding this consent and the Services and have had my questions answered to my satisfaction. . I give my informed consent for the services to be provided through the use of telemedicine in my medical care

## 2019-11-24 NOTE — Progress Notes (Signed)
Virtual Visit via Telephone Note   This visit type was conducted due to national recommendations for restrictions regarding the COVID-19 Pandemic (e.g. social distancing) in an effort to limit this patient's exposure and mitigate transmission in our community.  Due to her co-morbid illnesses, this patient is at least at moderate risk for complications without adequate follow up.  This format is felt to be most appropriate for this patient at this time.  The patient did not have access to video technology/had technical difficulties with video requiring transitioning to audio format only (telephone).  All issues noted in this document were discussed and addressed.  No physical exam could be performed with this format.  Please refer to the patient's chart for her  consent to telehealth for [facility]    Date:  11/24/2019   ID:  Dawn Perkins, DOB 1955-07-03, MRN [id number] The patient was identified using 2 identifiers.  Patient Location: Home Provider Location: Home Office  PCP:  Julianne Handler, NP  Cardiologist:  Olga Millers, MD  Electrophysiologist:  None   Evaluation Performed:  Follow-Up Visit  Chief Complaint:  none  History of Present Illness:    Dawn Perkins is a 64 y.o. female with a hx of palpitations and PSVT, dyslipidemia, family history of coronary disease, asthma, and esophagitis.  She has had a prior negative treadmill and essentially normal echo in 2018.  She presents to the office today with complaints of epigastric pain and pressure.  This happened about 2 weeks ago.  She says it was fairly constant, waxing and waning for couple days.  She thought it might be her esophagitis.  She went to see Dr. Charm Barges in [facility] her gastroenterologist.  The patient has an allergy to PPIs.  She is on Pepcid.  After evaluation by Dr. Charm Barges he was concerned her symptoms may be cardiac.  He suggested cardiology evaluation.  I saw her in the office 10/13/2019.  She does have  multiple risk factors including a family history.  I suggested we obtain a Myoview.  This was low risk with small apical scar versus breast attenuation.  She also had a right carotid bruit.  Carotid Doppler showed a 1 to 39% bilateral internal carotid artery narrowing.  She was contacted today for follow-up.  She has been doing well since we last spoke.  She has some vague shortness of breath with exertion which she attributes to a sedentary job.  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  She just turned 1 and is thinking about retirement.  She has a new primary care provider who added chlorthalidone 12.5 mg a day.  Her blood pressure at home is labile at times.  I have asked her to keep an eye on this and report back to her primary care provider.  She does note she has been sensitive to medications in the past.  She has some vague myalgias which she attributes to rosuvastatin 2.5 mg twice a week.  She admits she does not take this on a regular basis.   The patient does not have symptoms concerning for COVID-19 infection (fever, chills, cough, or new shortness of breath).    Past Medical History:  Diagnosis Date  . Arm pain    left  . Asthma   . Diabetes mellitus without complication (HCC)   . GERD (gastroesophageal reflux disease)   . Head pain   . Hyperlipidemia   . Hypertension   .  Neck pain   . Renal insufficiency   . SVT (supraventricular tachycardia) (HCC)    Past Surgical History:  Procedure Laterality Date  . ABDOMINAL HYSTERECTOMY    . APPENDECTOMY    . CHOLECYSTECTOMY    . TONSILLECTOMY       Current Meds  Medication Sig  . Acetaminophen (TYLENOL PO) Take by mouth as needed.  . ALPRAZolam (XANAX) 0.25 MG tablet Take 0.25 mg by mouth at bedtime as needed for anxiety.  . budesonide (PULMICORT FLEXHALER) 180 MCG/ACT inhaler INHALE 2 PUFFS INTO THE LUNGS 2 (TWO) TIMES DAILY. RINSE, GARGLE, AND SPIT AFTER USE.  .  chlorthalidone (HYGROTON) 25 MG tablet Take 12.5 mg by mouth.   . Cholecalciferol (VITAMIN D) 2000 units tablet Take 2,000 Units by mouth daily.  . Coenzyme Q10 (COQ-10 PO) Take by mouth daily.  . famotidine (PEPCID) 40 MG tablet Take 40 mg by mouth 2 (two) times daily.  Marland Kitchen levalbuterol (XOPENEX HFA) 45 MCG/ACT inhaler Inhale two puffs every 4-6 hours if needed for cough or wheeze.  Marland Kitchen MAGNESIUM PO Take by mouth as needed.  . nitroGLYCERIN (NITROSTAT) 0.4 MG SL tablet Place 0.4 mg under the tongue every 5 (five) minutes as needed for chest pain.  . rosuvastatin (CRESTOR) 5 MG tablet Take 2.5 mg by mouth daily. Take 0.5 tablet daily  . Triamcinolone Acetonide (NASACORT ALLERGY 24HR NA) Place 1 spray into both nostrils daily.  . verapamil (CALAN-SR) 240 MG CR tablet Take 240 mg by mouth at bedtime.     Allergies:   Effexor xr [venlafaxine hcl], Telmisartan, Esomeprazole, Esomeprazole magnesium, Omeprazole, Omeprazole magnesium, Other, Tape, Venlafaxine, and Nexium i.v. [esomeprazole sodium]   Social History   Tobacco Use  . Smoking status: Never Smoker  . Smokeless tobacco: Never Used  Vaping Use  . Vaping Use: Never used  Substance Use Topics  . Alcohol use: No  . Drug use: No     Family Hx: The patient's family history includes Alzheimer's disease in her mother; Asthma in her brother; Diabetes in her brother, brother, father, and mother; Heart disease in her brother, brother, and father; Heart failure in her brother, father, and mother; Hypertension in her brother, father, and mother; Stroke in her mother. There is no history of Neuropathy, Multiple sclerosis, Migraines, or Ataxia.  ROS:   Please see the history of present illness.    All other systems reviewed and are negative.   Prior CV studies:   The following studies were reviewed today: Myoview 11/16/2019-  The left ventricular ejection fraction is hyperdynamic (>65%).  Nuclear stress EF: 71%.  There was no ST segment  deviation noted during stress.  This is a low risk study.   Small fixed moderate perfusion defect in the mid and apical anterior wall.  There is significant extracardiac uptake inferiorly likely representing bowel radioisotope tracer uptake which may impact interpretation of perfusion in the inferior wall.  Findings suggest possible prior anterior wall infarct, however LVEF is hyperdynamic and anterior wall motion is grossly preserved, suggesting this finding is most likely representative of attenuation artifact, possibly from breast tissue.   Carotid dopplers 11/16/2019- Summary:  Right Carotid: Velocities in the right ICA are consistent with a 1-39%  stenosis.   Left Carotid: Velocities in the left ICA are consistent with a 1-39%  stenosis.   Vertebrals: Bilateral vertebral arteries demonstrate antegrade flow.  Subclavians: Normal flow hemodynamics were seen in bilateral subclavian        arteries.    Labs/Other  Tests and Data Reviewed:    EKG:  An ECG dated 10/13/2019 was personally reviewed today and demonstrated:  NSR- HR 78  Recent Labs: No results found for requested labs within last 8760 hours.   Recent Lipid Panel No results found for: CHOL, TRIG, HDL, CHOLHDL, LDLCALC, LDLDIRECT  Wt Readings from Last 3 Encounters:  11/24/19 159 lb (72.1 kg)  11/16/19 162 lb (73.5 kg)  10/13/19 162 lb 3.2 oz (73.6 kg)     Objective:    Vital Signs:  Ht 5\' 4"  (1.626 m)   Wt 159 lb (72.1 kg)   BMI 27.29 kg/m    VITAL SIGNS:  reviewed  ASSESSMENT & PLAN:    Chest pain- Suspect this is non cardiac- low risk Myoview  HTN- I suggested she monitor her B/P at home and report findings to her PCP  RCA bruit- Mild carotid disease.  I enforced the need for lipid therapy and B/P control.  Dyslipidemia- I encouraged her to start a regular regimen of Crestor 2.5 mg twice a week.  After a month or two she could try a statin holiday if she continues to have myalgias.  If her  symptoms improve she may truly be statin intolerant, if not she should resume statin rx.   Plan: F/U one year Dr  COVID-19 Education: The signs and symptoms of COVID-19 were discussed with the patient and how to seek care for testing (follow up with PCP or arrange E-visit).  The importance of social distancing was discussed today.  Time:   Today, I have spent 20 minutes with the patient with telehealth technology discussing the above problems.     Medication Adjustments/Labs and Tests Ordered: Current medicines are reviewed at length with the patient today.  Concerns regarding medicines are outlined above.   Tests Ordered: No orders of the defined types were placed in this encounter.   Medication Changes: No orders of the defined types were placed in this encounter.   Follow Up:  In Person one year with Dr Jens Som  Signed, Jens Som, PA-C  11/24/2019 11:56 AM    [facility]

## 2019-11-24 NOTE — Telephone Encounter (Signed)
Called patient to discuss AVS instructions gave [name]'s recommendations and patient voiced understanding. AVS summary mailed to patient.    

## 2020-03-14 DIAGNOSIS — Z1231 Encounter for screening mammogram for malignant neoplasm of breast: Secondary | ICD-10-CM | POA: Diagnosis not present

## 2020-05-01 ENCOUNTER — Other Ambulatory Visit: Payer: Self-pay | Admitting: Allergy and Immunology

## 2020-06-25 ENCOUNTER — Telehealth: Payer: Self-pay | Admitting: Cardiology

## 2020-06-25 DIAGNOSIS — I6523 Occlusion and stenosis of bilateral carotid arteries: Secondary | ICD-10-CM

## 2020-06-25 DIAGNOSIS — E782 Mixed hyperlipidemia: Secondary | ICD-10-CM | POA: Diagnosis not present

## 2020-06-25 DIAGNOSIS — I872 Venous insufficiency (chronic) (peripheral): Secondary | ICD-10-CM

## 2020-06-25 DIAGNOSIS — J4520 Mild intermittent asthma, uncomplicated: Secondary | ICD-10-CM | POA: Diagnosis not present

## 2020-06-25 DIAGNOSIS — R0989 Other specified symptoms and signs involving the circulatory and respiratory systems: Secondary | ICD-10-CM | POA: Diagnosis not present

## 2020-06-25 DIAGNOSIS — F410 Panic disorder [episodic paroxysmal anxiety] without agoraphobia: Secondary | ICD-10-CM

## 2020-06-25 DIAGNOSIS — E119 Type 2 diabetes mellitus without complications: Secondary | ICD-10-CM | POA: Diagnosis not present

## 2020-06-25 DIAGNOSIS — I10 Essential (primary) hypertension: Secondary | ICD-10-CM | POA: Diagnosis not present

## 2020-06-25 HISTORY — DX: Panic disorder (episodic paroxysmal anxiety): F41.0

## 2020-06-25 HISTORY — DX: Venous insufficiency (chronic) (peripheral): I87.2

## 2020-06-25 HISTORY — DX: Occlusion and stenosis of bilateral carotid arteries: I65.23

## 2020-06-25 NOTE — Telephone Encounter (Signed)
The patient called concerning her stress test last year. She saw a new PCP that made note of the results. She was calling to go over them again. All questions have been answered.     The left ventricular ejection fraction is hyperdynamic (>65%).  Nuclear stress EF: 71%.  There was no ST segment deviation noted during stress.  This is a low risk study.   Small fixed moderate perfusion defect in the mid and apical anterior wall.  There is significant extracardiac uptake inferiorly likely representing bowel radioisotope tracer uptake which may impact interpretation of perfusion in the inferior wall.  Findings suggest possible prior anterior wall infarct, however LVEF is hyperdynamic and anterior wall motion is grossly preserved, suggesting this finding is most likely representative of attenuation artifact, possibly from breast tissue.

## 2020-06-25 NOTE — Telephone Encounter (Signed)
Patient would like someone to go over the results of her recent stress test with her.  She saw a new PCP today (Dr. Feliciana Rossetti at [facility] ).   Her new PCP noticed in the notes something about evidence of a prior heart attack. He thinks that may be an error in documentation and would like clarification.   The patient would also like clarification for her peace of mind

## 2020-07-25 DIAGNOSIS — M4712 Other spondylosis with myelopathy, cervical region: Secondary | ICD-10-CM | POA: Diagnosis not present

## 2020-07-25 DIAGNOSIS — H8110 Benign paroxysmal vertigo, unspecified ear: Secondary | ICD-10-CM | POA: Diagnosis not present

## 2020-08-02 DIAGNOSIS — K200 Eosinophilic esophagitis: Secondary | ICD-10-CM | POA: Diagnosis not present

## 2020-08-22 DIAGNOSIS — Z20828 Contact with and (suspected) exposure to other viral communicable diseases: Secondary | ICD-10-CM | POA: Diagnosis not present

## 2020-08-22 DIAGNOSIS — U071 COVID-19: Secondary | ICD-10-CM | POA: Diagnosis not present

## 2020-08-22 DIAGNOSIS — R070 Pain in throat: Secondary | ICD-10-CM | POA: Diagnosis not present

## 2020-08-22 DIAGNOSIS — R509 Fever, unspecified: Secondary | ICD-10-CM | POA: Diagnosis not present

## 2020-08-22 DIAGNOSIS — J0120 Acute ethmoidal sinusitis, unspecified: Secondary | ICD-10-CM | POA: Diagnosis not present

## 2020-08-23 ENCOUNTER — Other Ambulatory Visit: Payer: Self-pay | Admitting: Allergy and Immunology

## 2020-08-26 DIAGNOSIS — I951 Orthostatic hypotension: Secondary | ICD-10-CM | POA: Diagnosis not present

## 2020-08-26 DIAGNOSIS — E876 Hypokalemia: Secondary | ICD-10-CM | POA: Diagnosis not present

## 2020-08-26 DIAGNOSIS — U071 COVID-19: Secondary | ICD-10-CM | POA: Diagnosis not present

## 2020-08-26 DIAGNOSIS — S0990XA Unspecified injury of head, initial encounter: Secondary | ICD-10-CM | POA: Diagnosis not present

## 2020-08-26 DIAGNOSIS — R059 Cough, unspecified: Secondary | ICD-10-CM | POA: Diagnosis not present

## 2020-08-26 DIAGNOSIS — M542 Cervicalgia: Secondary | ICD-10-CM | POA: Diagnosis not present

## 2020-08-26 DIAGNOSIS — K219 Gastro-esophageal reflux disease without esophagitis: Secondary | ICD-10-CM | POA: Diagnosis not present

## 2020-08-26 DIAGNOSIS — R55 Syncope and collapse: Secondary | ICD-10-CM | POA: Diagnosis not present

## 2020-08-26 DIAGNOSIS — R509 Fever, unspecified: Secondary | ICD-10-CM | POA: Diagnosis not present

## 2020-08-26 DIAGNOSIS — E871 Hypo-osmolality and hyponatremia: Secondary | ICD-10-CM | POA: Diagnosis not present

## 2020-08-26 DIAGNOSIS — I119 Hypertensive heart disease without heart failure: Secondary | ICD-10-CM | POA: Diagnosis not present

## 2020-08-26 DIAGNOSIS — Z79899 Other long term (current) drug therapy: Secondary | ICD-10-CM | POA: Diagnosis not present

## 2020-08-26 DIAGNOSIS — R519 Headache, unspecified: Secondary | ICD-10-CM | POA: Diagnosis not present

## 2020-08-26 DIAGNOSIS — Z8249 Family history of ischemic heart disease and other diseases of the circulatory system: Secondary | ICD-10-CM | POA: Diagnosis not present

## 2020-08-26 DIAGNOSIS — R531 Weakness: Secondary | ICD-10-CM | POA: Diagnosis not present

## 2020-08-26 DIAGNOSIS — R001 Bradycardia, unspecified: Secondary | ICD-10-CM | POA: Diagnosis not present

## 2020-08-26 DIAGNOSIS — E119 Type 2 diabetes mellitus without complications: Secondary | ICD-10-CM | POA: Diagnosis not present

## 2020-08-26 DIAGNOSIS — Z9181 History of falling: Secondary | ICD-10-CM | POA: Diagnosis not present

## 2020-08-26 DIAGNOSIS — E878 Other disorders of electrolyte and fluid balance, not elsewhere classified: Secondary | ICD-10-CM | POA: Diagnosis not present

## 2020-08-26 DIAGNOSIS — Z8679 Personal history of other diseases of the circulatory system: Secondary | ICD-10-CM | POA: Diagnosis not present

## 2020-08-26 DIAGNOSIS — I10 Essential (primary) hypertension: Secondary | ICD-10-CM | POA: Diagnosis not present

## 2020-08-26 DIAGNOSIS — R0602 Shortness of breath: Secondary | ICD-10-CM | POA: Diagnosis not present

## 2020-08-27 DIAGNOSIS — E878 Other disorders of electrolyte and fluid balance, not elsewhere classified: Secondary | ICD-10-CM

## 2020-08-27 DIAGNOSIS — I119 Hypertensive heart disease without heart failure: Secondary | ICD-10-CM

## 2020-08-27 DIAGNOSIS — R55 Syncope and collapse: Secondary | ICD-10-CM

## 2020-08-27 DIAGNOSIS — U071 COVID-19: Secondary | ICD-10-CM

## 2020-08-28 DIAGNOSIS — E878 Other disorders of electrolyte and fluid balance, not elsewhere classified: Secondary | ICD-10-CM

## 2020-08-28 DIAGNOSIS — U071 COVID-19: Secondary | ICD-10-CM | POA: Diagnosis not present

## 2020-08-28 DIAGNOSIS — I119 Hypertensive heart disease without heart failure: Secondary | ICD-10-CM | POA: Diagnosis not present

## 2020-08-28 DIAGNOSIS — R55 Syncope and collapse: Secondary | ICD-10-CM

## 2020-08-28 DIAGNOSIS — E871 Hypo-osmolality and hyponatremia: Secondary | ICD-10-CM

## 2020-08-29 ENCOUNTER — Telehealth: Payer: Self-pay | Admitting: Cardiology

## 2020-08-29 DIAGNOSIS — R55 Syncope and collapse: Secondary | ICD-10-CM

## 2020-08-29 NOTE — Telephone Encounter (Signed)
Spoke to patient. She reports that she was just admitted in [place] and discharged yesterday. Per Dr. Bing Matter she needs a monitor and follow up appointment. Will check with him on the specifics of this once he gets to the office today.   Her blood pressure today is 167/109 and 140/89 and pulse 105. No dizziness, no headache, no blurred vision.

## 2020-08-29 NOTE — Telephone Encounter (Signed)
Pt c/o BP issue: STAT if pt c/o blurred vision, one-sided weakness or slurred speech  1. What are your last 5 BP readings? 167/109 this morning  2. Are you having any other symptoms (ex. Dizziness, headache, blurred vision, passed out)? No   3. What is your BP issue? Running high patient also was in the hospital and she states the doctor told her to call this mooring to get a heart monitor. Please advise.

## 2020-09-03 ENCOUNTER — Ambulatory Visit (INDEPENDENT_AMBULATORY_CARE_PROVIDER_SITE_OTHER): Payer: BC Managed Care – PPO

## 2020-09-03 DIAGNOSIS — R55 Syncope and collapse: Secondary | ICD-10-CM

## 2020-09-03 NOTE — Telephone Encounter (Signed)
Patient also scheduled for follow up visit.

## 2020-09-03 NOTE — Telephone Encounter (Signed)
Called Patient. Informed her we will do a monitor for 7 days. She is out of town and at [employer] now will have it mailed to her so she can put on this weekend when she gets home. She is still dizzy and having a high heart rate still. Advised her if this gets worse [place]. She will let us know if she needs anything between now and then. Will order monitor.

## 2020-09-08 DIAGNOSIS — R55 Syncope and collapse: Secondary | ICD-10-CM

## 2020-09-11 DIAGNOSIS — I10 Essential (primary) hypertension: Secondary | ICD-10-CM | POA: Diagnosis not present

## 2020-09-11 DIAGNOSIS — E876 Hypokalemia: Secondary | ICD-10-CM | POA: Diagnosis not present

## 2020-09-11 DIAGNOSIS — E871 Hypo-osmolality and hyponatremia: Secondary | ICD-10-CM | POA: Diagnosis not present

## 2020-09-11 DIAGNOSIS — R7303 Prediabetes: Secondary | ICD-10-CM | POA: Diagnosis not present

## 2020-09-11 DIAGNOSIS — Z8616 Personal history of COVID-19: Secondary | ICD-10-CM | POA: Diagnosis not present

## 2020-09-11 DIAGNOSIS — I471 Supraventricular tachycardia: Secondary | ICD-10-CM | POA: Diagnosis not present

## 2020-09-13 ENCOUNTER — Ambulatory Visit: Payer: BC Managed Care – PPO | Admitting: Allergy and Immunology

## 2020-09-16 ENCOUNTER — Telehealth: Payer: Self-pay | Admitting: Physician Assistant

## 2020-09-16 NOTE — Telephone Encounter (Signed)
Patient is a 65 year old female with past medical history of PSVT, palpitation, hyperlipidemia, family history of coronary artery disease, asthma and esophagitis.  She was previously seen by Dr. Jens Som.  Last visit was with Corine Shelter, PA-C on 10/13/2019.  Myoview obtained on 11/16/2019 was normal.  More recently, patient went to [facility] with dizzy spell, shortness of breath, and passing out spell.  She was diagnosed with COVID-19 with electrolyte imbalance including low sodium and low potassium.  Her chlorthalidone and verapamil were discontinued and she was placed on 50 mg daily of losartan upon discharge.  Although her blood pressure was quite low in the hospital, since discharge, systolic blood pressure has been creeping up into the 180s.  She is still having dizziness, since starting on the losartan, she also noticed tingling in her feet and lips but no obvious swelling in the lips or tongue.  She wore our heart monitor ordered by Dr. Bing Matter to rule out significant arrhythmia given the recent syncope.  In the past 2 days, systolic blood pressure has been peaking into the 180s.  She has decided to take extra 25 mg of losartan every night without improvement.  She is currently scheduled to see Dr. Bing Matter on 09/27/2020.  Given the elevated blood pressure, I asked her to resume chlorthalidone 12.5 mg daily.  She needs to keep herself adequately hydrated given the recent syncope.  I am hesitant to restart verapamil without reviewing her heart monitor report to see if there are any significant bradycardia or pauses that could contribute to the recent syncope.  Although the syncope happened in the setting of COVID-19 infection and its resultant low blood pressure.  If she is unable to tolerate losartan, may consider amlodipine as well.  She says she thinks she had intolerance of telmisartan in the past.   She will need a basic metabolic panel and earlier follow-up.  Unfortunately [facility] is completely full for the next month.  I will send a message to our [place] office to see if there is a earlier slot to see this patient.  She will also need a basic metabolic panel this week as well.  Please contact the patient to arrange earlier follow-up with Dr. Bing Matter and also a basic metabolic panel

## 2020-09-17 ENCOUNTER — Ambulatory Visit: Payer: BC Managed Care – PPO | Admitting: Cardiology

## 2020-09-17 ENCOUNTER — Other Ambulatory Visit: Payer: Self-pay

## 2020-09-17 ENCOUNTER — Encounter: Payer: Self-pay | Admitting: Cardiology

## 2020-09-17 VITALS — BP 180/104 | HR 95 | Ht 64.0 in | Wt 158.0 lb

## 2020-09-17 DIAGNOSIS — I471 Supraventricular tachycardia: Secondary | ICD-10-CM

## 2020-09-17 DIAGNOSIS — I10 Essential (primary) hypertension: Secondary | ICD-10-CM | POA: Diagnosis not present

## 2020-09-17 DIAGNOSIS — E119 Type 2 diabetes mellitus without complications: Secondary | ICD-10-CM | POA: Diagnosis not present

## 2020-09-17 DIAGNOSIS — R42 Dizziness and giddiness: Secondary | ICD-10-CM

## 2020-09-17 DIAGNOSIS — R55 Syncope and collapse: Secondary | ICD-10-CM

## 2020-09-17 MED ORDER — CHLORTHALIDONE 25 MG PO TABS: 12.5000 mg | ORAL_TABLET | Freq: Every day | ORAL | 1 refills | Status: DC

## 2020-09-17 MED ORDER — AMLODIPINE BESYLATE 5 MG PO TABS
5.0000 mg | ORAL_TABLET | Freq: Every day | ORAL | 1 refills | Status: DC
Start: 2020-09-17 — End: 2021-02-14

## 2020-09-17 NOTE — Patient Instructions (Signed)
Medication Instructions:  Your physician has recommended you make the following change in your medication:  STOP: Losartan   START: Amlodipine 5 mg daily   *If you need a refill on your cardiac medications before your next appointment, please call your pharmacy*   Lab Work: None If you have labs (blood work) drawn today and your tests are completely normal, you will receive your results only by: MyChart Message (if you have MyChart) OR A paper copy in the mail If you have any lab test that is abnormal or we need to change your treatment, we will call you to review the results.   Testing/Procedures: None   Follow-Up: At [facility], you and your health needs are our priority.  As part of our continuing mission to provide you with exceptional heart care, we have created designated Provider Care Teams.  These Care Teams include your primary Cardiologist (physician) and Advanced Practice Providers (APPs -  Physician Assistants and Nurse Practitioners) who all work together to provide you with the care you need, when you need it.  We recommend signing up for the patient portal called "MyChart".  Sign up information is provided on this After Visit Summary.  MyChart is used to connect with patients for Virtual Visits (Telemedicine).  Patients are able to view lab/test results, encounter notes, upcoming appointments, etc.  Non-urgent messages can be sent to your provider as well.   To learn more about what you can do with MyChart, go to [email or web address].    Your next appointment:   3 week(s)  The format for your next appointment:   In Person  Provider:   Gypsy Balsam, MD   Other Instructions  Amlodipine Tablets What is this medication? AMLODIPINE (am LOE di peen) treats high blood pressure and prevents chest pain (angina). It works by relaxing the blood vessels, which helps decrease the amount of work your heart has to do. It belongs to a group of medicationscalled  calcium channel blockers. This medicine may be used for other purposes; ask your health care provider orpharmacist if you have questions. COMMON BRAND NAME(S): Norvasc What should I tell my care team before I take this medication? They need to know if you have any of these conditions: Heart disease Liver disease An unusual or allergic reaction to amlodipine, other medications, foods, dyes, or preservatives Pregnant or trying to get pregnant Breast-feeding How should I use this medication? Take this medication by mouth. Take it as directed on the prescription label at the same time every day. You can take it with or without food. If it upsets your stomach, take it with food. Keep taking it unless your care team tells youto stop. Talk to your care team about the use of this medication in children. While it may be prescribed for children as young as 6 for selected conditions,precautions do apply. Overdosage: If you think you have taken too much of this medicine contact apoison control center or emergency room at once. NOTE: This medicine is only for you. Do not share this medicine with others. What if I miss a dose? If you miss a dose, take it as soon as you can. If it is almost time for yournext dose, take only that dose. Do not take double or extra doses. What may interact with this medication? Clarithromycin Cyclosporine Diltiazem Itraconazole Simvastatin Tacrolimus This list may not describe all possible interactions. Give your health care provider a list of all the medicines, herbs, non-prescription drugs, or dietary supplements you use.  Also tell them if you smoke, drink alcohol, or use illegaldrugs. Some items may interact with your medicine. What should I watch for while using this medication? Visit your health care provider for regular checks on your progress. Check your blood pressure as directed. Ask your health care provider what your bloodpressure should be. Also, find out when  you should contact him or her. Do not treat yourself for coughs, colds, or pain while you are using this medication without asking your health care provider for advice. Somemedications may increase your blood pressure. You may get drowsy or dizzy. Do not drive, use machinery, or do anything that needs mental alertness until you know how this medication affects you. Do not stand up or sit up quickly, especially if you are an older patient. This reduces the risk of dizzy or fainting spells. Alcohol can make you more drowsyand dizzy. Avoid alcoholic drinks. What side effects may I notice from receiving this medication? Side effects that you should report to your care team as soon as possible: Allergic reactions-skin rash, itching, hives, swelling of the face, lips, tongue, or throat Heart attack-pain or tightness in the chest, shoulders, arms, or jaw, nausea, shortness of breath, cold or clammy skin, feeling faint or lightheaded Low blood pressure-dizziness, feeling faint or lightheaded, blurry vision Side effects that usually do not require medical attention (report these toyour care team if they continue or are bothersome): Facial flushing, redness Heart palpitations-rapid, pounding, or irregular heartbeat Nausea Stomach pain Swelling of the ankles, hands, or feet This list may not describe all possible side effects. Call your doctor for medical advice about side effects. You may report side effects to FDA at1-800-FDA-1088. Where should I keep my medication? Keep out of the reach of children and pets. Store at room temperature between 20 and 25 degrees C (68 and 77 degrees F). Protect from light and moisture. Keep the container tightly closed. Get rid ofany unused medication after the expiration date. To get rid of medications that are no longer needed or have expired: Take the medication to a medication take-back program. Check with your pharmacy or law enforcement to find a location. If you  cannot return the medication, check the label or package insert to see if the medication should be thrown out in the garbage or flushed down the toilet. If you are not sure, ask your health care provider. If it is safe to put in the trash, empty the medication out of the container. Mix the medication with cat litter, dirt, coffee grounds, or other unwanted substance. Seal the mixture in a bag or container. Put it in the trash. NOTE: This sheet is a summary. It may not cover all possible information. If you have questions about this medicine, talk to your doctor, pharmacist, orhealth care provider.  2022 Elsevier/Gold Standard (2020-02-11 14:59:47)

## 2020-09-17 NOTE — Telephone Encounter (Signed)
Spoke to the patient just now and let her know that Dr. Bing Matter can see her today at 10:20 as he has an open slot. She verbalizes understanding and thanks me for the call back.

## 2020-09-17 NOTE — Progress Notes (Signed)
Cardiology Office Note:    Date:  09/17/2020   ID:  Dawn Perkins, DOB 1955-06-21, MRN [id number]  PCP:  Gordan Payment., MD  Cardiologist:  Gypsy Balsam, MD    Referring MD: Julianne Handler, NP   Chief Complaint  Patient presents with   Loss of Consciousness    History of Present Illness:    Dawn Perkins is a 65 y.o. female with past medical history significant for supraventricular tachycardia, essential hypertension, diabetes.  Recently she ended up coming to [facility] because of episode of syncope.  It looks like it was related to dehydration some vagal reaction on top of that she was fine to have COVID-19 infection.  She recovered from this.  She was taking verapamil which was discontinued she was put on losartan, however she feels awful she complained of having very high blood pressure in the medevac elevated today.  She said before she takes her losartan her blood pressure is good when she take it but much higher.  Also complaining of feeling dizzy weak and not herself.  Just not well.  Denies having any chest pain tightness squeezing pressure burning chest.  She cannot function.  She cannot walk and she is to manage quite complex task with no difficulties.  Past Medical History:  Diagnosis Date   Arm pain    left   Asthma    Diabetes mellitus without complication (HCC)    GERD (gastroesophageal reflux disease)    Head pain    Hyperlipidemia    Hypertension    Neck pain    Renal insufficiency    SVT (supraventricular tachycardia) (HCC)     Past Surgical History:  Procedure Laterality Date   ABDOMINAL HYSTERECTOMY     APPENDECTOMY     CHOLECYSTECTOMY     TONSILLECTOMY      Current Medications: Current Meds  Medication Sig   Acetaminophen (TYLENOL PO) Take by mouth as needed.   ALPRAZolam (XANAX) 0.25 MG tablet Take 0.25 mg by mouth at bedtime as needed for anxiety.   amLODipine (NORVASC) 5 MG tablet Take 1 tablet (5 mg total) by mouth daily.    Cholecalciferol (VITAMIN D) 2000 units tablet Take 2,000 Units by mouth daily.   Coenzyme Q10 (COQ-10 PO) Take by mouth daily.   famotidine (PEPCID) 40 MG tablet Take 40 mg by mouth 2 (two) times daily.   levalbuterol (XOPENEX HFA) 45 MCG/ACT inhaler Inhale two puffs every 4-6 hours if needed for cough or wheeze.   meclizine (ANTIVERT) 25 MG tablet Take 12.5 mg by mouth 3 (three) times daily as needed.   nitroGLYCERIN (NITROSTAT) 0.4 MG SL tablet Place 0.4 mg under the tongue every 5 (five) minutes as needed for chest pain.   PULMICORT FLEXHALER 180 MCG/ACT inhaler INHALE 2 PUFFS INTO THE LUNGS 2 (TWO) TIMES DAILY. RINSE, GARGLE, AND SPIT AFTER USE.   Triamcinolone Acetonide (NASACORT ALLERGY 24HR NA) Place 1 spray into both nostrils daily.   [DISCONTINUED] chlorthalidone (HYGROTON) 25 MG tablet Take 12.5 mg by mouth.    [DISCONTINUED] losartan (COZAAR) 50 MG tablet Take 50 mg by mouth 2 (two) times daily. Take 50 mg in am and 25 mg in pm     Allergies:   Effexor xr [venlafaxine hcl], Telmisartan, Esomeprazole, Esomeprazole magnesium, Omeprazole, Omeprazole magnesium, Other, Statins, Tape, Venlafaxine, and Nexium i.v. [esomeprazole sodium]   Social History   Socioeconomic History   Marital status: Married    Spouse name: Rayna Sexton   Number of  children: 2   Years of education: 12+   Highest education level: Not on file  Occupational History   Occupation: [employer] services  Tobacco Use   Smoking status: Never   Smokeless tobacco: Never  Vaping Use   Vaping Use: Never used  Substance and Sexual Activity   Alcohol use: No   Drug use: No   Sexual activity: Not on file  Other Topics Concern   Not on file  Social History Narrative   Lives with spouse   Caffeine use:  caf free diet sodas 1-1.5 daily   Social Determinants of Health   Financial Resource Strain: Not on file  Food Insecurity: Not on file  Transportation Needs: Not on file  Physical Activity: Not on file  Stress: Not  on file  Social Connections: Not on file     Family History: The patient's family history includes Alzheimer's disease in her mother; Asthma in her brother; Diabetes in her brother, brother, father, and mother; Heart disease in her brother, brother, and father; Heart failure in her brother, father, and mother; Hypertension in her brother, father, and mother; Stroke in her mother. There is no history of Neuropathy, Multiple sclerosis, Migraines, or Ataxia. ROS:   Please see the history of present illness.    All 14 point review of systems negative except as described per history of present illness  EKGs/Labs/Other Studies Reviewed:      Recent Labs: No results found for requested labs within last 8760 hours.  Recent Lipid Panel No results found for: CHOL, TRIG, HDL, CHOLHDL, VLDL, LDLCALC, LDLDIRECT  Physical Exam:    VS:  BP (!) 180/104 (BP Location: Right Arm, Patient Position: Sitting, Cuff Size: Normal)   Pulse 95   Ht 5\' 4"  (1.626 m)   Wt 158 lb (71.7 kg)   SpO2 97%   BMI 27.12 kg/m     Wt Readings from Last 3 Encounters:  09/17/20 158 lb (71.7 kg)  11/24/19 159 lb (72.1 kg)  11/16/19 162 lb (73.5 kg)     GEN:  Well nourished, well developed in no acute distress HEENT: Normal NECK: No JVD; No carotid bruits LYMPHATICS: No lymphadenopathy CARDIAC: RRR, no murmurs, no rubs, no gallops RESPIRATORY:  Clear to auscultation without rales, wheezing or rhonchi  ABDOMEN: Soft, non-tender, non-distended MUSCULOSKELETAL:  No edema; No deformity  SKIN: Warm and dry LOWER EXTREMITIES: no swelling NEUROLOGIC:  Alert and oriented x 3 PSYCHIATRIC:  Normal affect   ASSESSMENT:    1. SVT (supraventricular tachycardia) (HCC)   2. Essential hypertension   3. Non-insulin dependent type 2 diabetes mellitus (HCC)   4. Dizziness   5. Syncope and collapse    PLAN:    In order of problems listed above:  SVT denies have any palpitations.  She wore a monitor that she returned but  did not have results back yet Essential hypertension clearly uncontrolled.  I asked her to discontinue losartan.  Apparently she was allergic to telmisartan.  I will ask her to start taking amlodipine 5 mg daily.  She is already taking diuretic which I will continue at very small dose. Diabetes currently stable Dizziness look very nonspecific.  She did wear monitor awaiting results of it. Syncope and collapse I did review record from hospital.  Felt to be related to dehydration COVID-19 infection as well as vagal reaction.  Overall doing well from that point review meaning she does have an episode of syncope but she complained of having dizziness  which typically happen when she walks..  Awaiting results of monitor   Medication Adjustments/Labs and Tests Ordered: Current medicines are reviewed at length with the patient today.  Concerns regarding medicines are outlined above.  Orders Placed This Encounter  Procedures   EKG 12-Lead   Medication changes:  Meds ordered this encounter  Medications   amLODipine (NORVASC) 5 MG tablet    Sig: Take 1 tablet (5 mg total) by mouth daily.    Dispense:  90 tablet    Refill:  1   chlorthalidone (HYGROTON) 25 MG tablet    Sig: Take 0.5 tablets (12.5 mg total) by mouth daily.    Dispense:  45 tablet    Refill:  1    Signed, Georgeanna Lea, MD, [facility] 09/17/2020 12:05 PM    [facility]

## 2020-09-21 DIAGNOSIS — R42 Dizziness and giddiness: Secondary | ICD-10-CM | POA: Diagnosis not present

## 2020-09-21 DIAGNOSIS — H9319 Tinnitus, unspecified ear: Secondary | ICD-10-CM | POA: Diagnosis not present

## 2020-09-21 DIAGNOSIS — Z87898 Personal history of other specified conditions: Secondary | ICD-10-CM | POA: Diagnosis not present

## 2020-09-21 DIAGNOSIS — R55 Syncope and collapse: Secondary | ICD-10-CM | POA: Diagnosis not present

## 2020-09-21 DIAGNOSIS — Z8679 Personal history of other diseases of the circulatory system: Secondary | ICD-10-CM | POA: Diagnosis not present

## 2020-09-27 ENCOUNTER — Ambulatory Visit: Payer: BC Managed Care – PPO | Admitting: Cardiology

## 2020-10-03 ENCOUNTER — Telehealth: Payer: Self-pay

## 2020-10-03 NOTE — Telephone Encounter (Signed)
-----   Message from Georgeanna Lea, MD sent at 10/03/2020 12:30 PM EDT ----- Monitor showed strips of supraventricular tachycardia.  Multiple trigger events showing normal sinus rhythm.  We will talk about potential treatment during the office visit

## 2020-10-03 NOTE — Telephone Encounter (Signed)
Spoke with patient regarding results and recommendation.  Patient verbalizes understanding and is agreeable to plan of care. Advised patient to call back with any issues or concerns.  

## 2020-10-04 DIAGNOSIS — K219 Gastro-esophageal reflux disease without esophagitis: Secondary | ICD-10-CM | POA: Insufficient documentation

## 2020-10-04 DIAGNOSIS — N289 Disorder of kidney and ureter, unspecified: Secondary | ICD-10-CM | POA: Insufficient documentation

## 2020-10-04 DIAGNOSIS — R519 Headache, unspecified: Secondary | ICD-10-CM | POA: Insufficient documentation

## 2020-10-04 DIAGNOSIS — M542 Cervicalgia: Secondary | ICD-10-CM | POA: Insufficient documentation

## 2020-10-04 DIAGNOSIS — M79603 Pain in arm, unspecified: Secondary | ICD-10-CM | POA: Insufficient documentation

## 2020-10-05 ENCOUNTER — Encounter: Payer: Self-pay | Admitting: Cardiology

## 2020-10-05 ENCOUNTER — Other Ambulatory Visit: Payer: Self-pay

## 2020-10-05 ENCOUNTER — Ambulatory Visit: Payer: BC Managed Care – PPO | Admitting: Cardiology

## 2020-10-05 VITALS — BP 138/72 | HR 98 | Ht 64.0 in | Wt 158.0 lb

## 2020-10-05 DIAGNOSIS — I471 Supraventricular tachycardia: Secondary | ICD-10-CM | POA: Diagnosis not present

## 2020-10-05 DIAGNOSIS — I10 Essential (primary) hypertension: Secondary | ICD-10-CM

## 2020-10-05 DIAGNOSIS — R7303 Prediabetes: Secondary | ICD-10-CM | POA: Diagnosis not present

## 2020-10-05 DIAGNOSIS — R55 Syncope and collapse: Secondary | ICD-10-CM

## 2020-10-05 DIAGNOSIS — R002 Palpitations: Secondary | ICD-10-CM

## 2020-10-05 NOTE — Patient Instructions (Signed)
Medication Instructions:  Your physician recommends that you continue on your current medications as directed. Please refer to the Current Medication list given to you today.  *If you need a refill on your cardiac medications before your next appointment, please call your pharmacy*   Lab Work: None ordered If you have labs (blood work) drawn today and your tests are completely normal, you will receive your results only by: MyChart Message (if you have MyChart) OR A paper copy in the mail If you have any lab test that is abnormal or we need to change your treatment, we will call you to review the results.   Testing/Procedures: None ordered   Follow-Up: At [facility], you and your health needs are our priority.  As part of our continuing mission to provide you with exceptional heart care, we have created designated Provider Care Teams.  These Care Teams include your primary Cardiologist (physician) and Advanced Practice Providers (APPs -  Physician Assistants and Nurse Practitioners) who all work together to provide you with the care you need, when you need it.  We recommend signing up for the patient portal called "MyChart".  Sign up information is provided on this After Visit Summary.  MyChart is used to connect with patients for Virtual Visits (Telemedicine).  Patients are able to view lab/test results, encounter notes, upcoming appointments, etc.  Non-urgent messages can be sent to your provider as well.   To learn more about what you can do with MyChart, go to [email or web address].    Your next appointment:   3 month(s)  The format for your next appointment:   In Person  Provider:   Robert Krasowski, MD   Other Instructions NA  

## 2020-10-05 NOTE — Progress Notes (Signed)
Cardiology Office Note:    Date:  10/05/2020   ID:  [name], DOB February 26, 1956, MRN [id number]  PCP:  Gordan Payment., MD  Cardiologist:  Gypsy Balsam, MD    Referring MD: Gordan Payment., MD   Chief Complaint  Patient presents with   Results    Discuss BP issues    History of Present Illness:    Dawn Perkins is a 65 y.o. female with past medical history significant for essential hypertension, prediabetes, supraventricular tachycardia.  In May she end up going to [facility] because of episode of syncope.  Feeling was it was related to dehydration, she also had some vasovagal reaction on top of that she was also sick with COVID-19 infection.  At that time she was also taking verapamil.  That medication has been discontinued she recovered from COVID-19 and she is gradually getting better last time I saw her she felt absolutely awful.  I did change some of her medication she was taking losartan and apparently was allergic to Micardis before therefore will discontinue this medication I initiated amlodipine.  She is feeling better.  She is here also to discuss results of her monitor which showed short runs of supraventricular tachycardia.  Interestingly all triggered events show either sinus rhythm or sinus tachycardia.  Past Medical History:  Diagnosis Date   Arm pain    left   Asthma    Diabetes mellitus without complication (HCC)    GERD (gastroesophageal reflux disease)    Head pain    Hyperlipidemia    Hypertension    Neck pain    Renal insufficiency    SVT (supraventricular tachycardia) (HCC)     Past Surgical History:  Procedure Laterality Date   ABDOMINAL HYSTERECTOMY     APPENDECTOMY     CHOLECYSTECTOMY     TONSILLECTOMY      Current Medications: Current Meds  Medication Sig   Acetaminophen (TYLENOL PO) Take 1 tablet by mouth as needed (pain). Unknown strenght   ALPRAZolam (XANAX) 0.25 MG tablet Take 0.25 mg by mouth at bedtime as needed for  anxiety.   amLODipine (NORVASC) 5 MG tablet Take 1 tablet (5 mg total) by mouth daily.   Ascorbic Acid (VITAMIN C ER PO) Take 1 tablet by mouth daily at 4 PM. Unknown strenght   chlorthalidone (HYGROTON) 25 MG tablet Take 0.5 tablets (12.5 mg total) by mouth daily.   Cholecalciferol (VITAMIN D) 2000 units tablet Take 2,000 Units by mouth daily.   Coenzyme Q10 (COQ-10 PO) Take 1 tablet by mouth daily. Unknown strength   famotidine (PEPCID) 40 MG tablet Take 40 mg by mouth 2 (two) times daily.   meclizine (ANTIVERT) 25 MG tablet Take 12.5 mg by mouth 3 (three) times daily as needed for dizziness or nausea.   nitroGLYCERIN (NITROSTAT) 0.4 MG SL tablet Place 0.4 mg under the tongue every 5 (five) minutes as needed for chest pain.   PULMICORT FLEXHALER 180 MCG/ACT inhaler INHALE 2 PUFFS INTO THE LUNGS 2 (TWO) TIMES DAILY. RINSE, GARGLE, AND SPIT AFTER USE. (Patient taking differently: Inhale 2 puffs into the lungs 2 (two) times daily. INHALE 2 PUFFS INTO THE LUNGS 2 (TWO) TIMES DAILY. RINSE, GARGLE, AND SPIT AFTER USE.)   Triamcinolone Acetonide (NASACORT ALLERGY 24HR NA) Place 1 spray into both nostrils daily.   zinc gluconate 50 MG tablet Take 50 mg by mouth daily.     Allergies:   Effexor xr [venlafaxine hcl], Telmisartan, Esomeprazole, Esomeprazole magnesium, Omeprazole, Omeprazole  magnesium, Other, Statins, Tape, Venlafaxine, and Nexium i.v. [esomeprazole sodium]   Social History   Socioeconomic History   Marital status: Married    Spouse name: Rayna Sexton   Number of children: 2   Years of education: 12+   Highest education level: Not on file  Occupational History   Occupation: [employer] services  Tobacco Use   Smoking status: Never   Smokeless tobacco: Never  Vaping Use   Vaping Use: Never used  Substance and Sexual Activity   Alcohol use: No   Drug use: No   Sexual activity: Not on file  Other Topics Concern   Not on file  Social History Narrative   Lives with spouse   Caffeine  use:  caf free diet sodas 1-1.5 daily   Social Determinants of Health   Financial Resource Strain: Not on file  Food Insecurity: Not on file  Transportation Needs: Not on file  Physical Activity: Not on file  Stress: Not on file  Social Connections: Not on file     Family History: The patient's family history includes Alzheimer's disease in her mother; Asthma in her brother; Diabetes in her brother, brother, father, and mother; Heart disease in her brother, brother, and father; Heart failure in her brother, father, and mother; Hypertension in her brother, father, and mother; Stroke in her mother. There is no history of Neuropathy, Multiple sclerosis, Migraines, or Ataxia. ROS:   Please see the history of present illness.    All 14 point review of systems negative except as described per history of present illness  EKGs/Labs/Other Studies Reviewed:      Recent Labs: No results found for requested labs within last 8760 hours.  Recent Lipid Panel No results found for: CHOL, TRIG, HDL, CHOLHDL, VLDL, LDLCALC, LDLDIRECT  Physical Exam:    VS:  BP 138/72 (BP Location: Left Arm, Patient Position: Sitting)   Pulse 98   Ht 5\' 4"  (1.626 m)   Wt 158 lb (71.7 kg)   SpO2 99%   BMI 27.12 kg/m     Wt Readings from Last 3 Encounters:  10/05/20 158 lb (71.7 kg)  09/17/20 158 lb (71.7 kg)  11/24/19 159 lb (72.1 kg)     GEN:  Well nourished, well developed in no acute distress HEENT: Normal NECK: No JVD; No carotid bruits LYMPHATICS: No lymphadenopathy CARDIAC: RRR, no murmurs, no rubs, no gallops RESPIRATORY:  Clear to auscultation without rales, wheezing or rhonchi  ABDOMEN: Soft, non-tender, non-distended MUSCULOSKELETAL:  No edema; No deformity  SKIN: Warm and dry LOWER EXTREMITIES: no swelling NEUROLOGIC:  Alert and oriented x 3 PSYCHIATRIC:  Normal affect   ASSESSMENT:    1. PSVT (paroxysmal supraventricular tachycardia) (HCC)   2. Essential hypertension   3. Vasovagal  syncope   4. Prediabetes   5. Palpitations    PLAN:    In order of problems listed above:  Supraventricular tachycardia.  Very rare episodes very short lasting not symptomatic.  We discussed potential options of treatment with either beta-blocker or calcium channel blocker.  However she afraid to take any new medication she is afraid to change any medication she seems to be doing quite well with the present regimen.  Therefore we decided to continue. Essential hypertension: She brought blood pressure measurements for me it was a good we will continue present management. History of vasovagal syncope and heart face of dehydration and COVID-19 infection.  I asked her to stay well-hydrated.  And I explained her what the  mechanism is what to do when it is coming. Prediabetes: Followed by antimedicine team. Palpitations rhythm strips from triggered event showing sinus rhythm with sinus tachycardia.  I encouraged her to be a little more active.  We will continue present management. That was very long visit   Medication Adjustments/Labs and Tests Ordered: Current medicines are reviewed at length with the patient today.  Concerns regarding medicines are outlined above.  No orders of the defined types were placed in this encounter.  Medication changes: No orders of the defined types were placed in this encounter.   Signed, Georgeanna Lea, MD, [facility] 10/05/2020 8:59 AM    [facility]

## 2020-10-08 ENCOUNTER — Telehealth: Payer: Self-pay | Admitting: Cardiology

## 2020-10-08 DIAGNOSIS — I471 Supraventricular tachycardia: Secondary | ICD-10-CM | POA: Diagnosis not present

## 2020-10-08 DIAGNOSIS — R55 Syncope and collapse: Secondary | ICD-10-CM | POA: Diagnosis not present

## 2020-10-08 DIAGNOSIS — I10 Essential (primary) hypertension: Secondary | ICD-10-CM | POA: Diagnosis not present

## 2020-10-08 DIAGNOSIS — F410 Panic disorder [episodic paroxysmal anxiety] without agoraphobia: Secondary | ICD-10-CM | POA: Diagnosis not present

## 2020-10-08 NOTE — Telephone Encounter (Signed)
Left message for patient to return the call.

## 2020-10-08 NOTE — Telephone Encounter (Signed)
Follow up:    Patient returning a call back for results 

## 2020-10-08 NOTE — Telephone Encounter (Signed)
   STAT if HR is under 50 or over 120 (normal HR is 60-100 beats per minute)  What is your heart rate? 93 (sitting)/ 104 (standing)  Do you have a log of your heart rate readings (document readings)?   Do you have any other symptoms? No  Patient wanted to follow up on the discussion to change some medications that were made during her visit 10/05/20.

## 2020-10-08 NOTE — Telephone Encounter (Signed)
Patient states today she went to her PCP for paperwork to be filled out. She asked them what did they think about her pulse being in the 90s-over 100. Her heart rate was 93 at rest today. The PCP tole her Toprol-XL or Lopressor could help. The PCP prescribed Lopressor 12.5 mg prn if rate is over 110 bpm. Patient states when her pulse is 100 she feels jittery. She said she take half of the lowest dose of Xanax and it helps. Cardizem was offered at her last office visit with Dr. Julien Nordmann but she refused due to Covid related things. "I am doing well on Amlodipine, I really do not want to go off of it". She states when she walks around she feels tired and her heart racing (107 bpm). "I do not want my blood pressure to bottom out again. I am scared to the the Lopressor they gave me" Will relay message to provider to advise.

## 2020-10-08 NOTE — Telephone Encounter (Signed)
Spoke with patient, see chart.    

## 2020-10-15 ENCOUNTER — Encounter: Payer: Self-pay | Admitting: Allergy and Immunology

## 2020-10-15 ENCOUNTER — Other Ambulatory Visit: Payer: Self-pay

## 2020-10-15 ENCOUNTER — Ambulatory Visit: Payer: BC Managed Care – PPO | Admitting: Allergy and Immunology

## 2020-10-15 VITALS — BP 108/84 | HR 88 | Resp 12 | Ht 63.8 in | Wt 158.8 lb

## 2020-10-15 DIAGNOSIS — J3089 Other allergic rhinitis: Secondary | ICD-10-CM

## 2020-10-15 DIAGNOSIS — J4540 Moderate persistent asthma, uncomplicated: Secondary | ICD-10-CM | POA: Diagnosis not present

## 2020-10-15 DIAGNOSIS — M5416 Radiculopathy, lumbar region: Secondary | ICD-10-CM | POA: Diagnosis not present

## 2020-10-15 DIAGNOSIS — K219 Gastro-esophageal reflux disease without esophagitis: Secondary | ICD-10-CM

## 2020-10-15 DIAGNOSIS — Z6827 Body mass index (BMI) 27.0-27.9, adult: Secondary | ICD-10-CM | POA: Diagnosis not present

## 2020-10-15 DIAGNOSIS — M5412 Radiculopathy, cervical region: Secondary | ICD-10-CM | POA: Diagnosis not present

## 2020-10-15 DIAGNOSIS — I10 Essential (primary) hypertension: Secondary | ICD-10-CM | POA: Diagnosis not present

## 2020-10-15 NOTE — Progress Notes (Signed)
[place] [facility] [place]   Follow-up Note  Referring Provider: Julianne Handler, NP Primary Provider: Gordan Payment., MD Date of Office Visit: 10/15/2020  Subjective:   Dawn Perkins (DOB: 01-Dec-1955) is a 65 y.o. female [facility] in re-evaluation of the following:  HPI: [name] returns to this clinic in evaluation of asthma and allergic rhinoconjunctivitis and LPR and history of eosinophilic esophagitis.  Her last visit to this clinic was 22 June 2019.  Her asthma has been relatively nonexistent and she rarely uses a short acting bronchodilator and has not required a systemic steroid to treat an exacerbation.  Likewise, she has had very little problems with her nose and has not required an antibiotic to treat an episode of sinusitis.  She has tapered off her Pulmicort and Nasacort.  Her reflux is under good control as long as she consistently uses her famotidine twice a day.  She cannot miss 1 dose or she develops very significant problems with reflux.  She apparently was admitted to the hospital in May 2029 for observation after undergoing syncope and hitting her head in the context of having COVID infection.  She did receive 3 Pfizer COVID vaccines prior to that episode of COVID.  Allergies as of 10/15/2020       Reactions   Effexor Xr [venlafaxine Hcl]    Telmisartan Shortness Of Breath, Rash   Esomeprazole    Rash   Esomeprazole Magnesium    Rash   Omeprazole    rash   Omeprazole Magnesium    rash   Other    Statins Other (See Comments)   Myalgia   Tape    Venlafaxine    Trouble breathing   Nexium I.v. [esomeprazole Sodium] Rash        Medication List    ALPRAZolam 0.25 MG tablet Commonly known as: XANAX Take 0.25 mg by mouth at bedtime as needed for anxiety.   amLODipine 5 MG tablet Commonly known as: NORVASC Take 1 tablet (5 mg total) by mouth daily.   chlorthalidone 25  MG tablet Commonly known as: HYGROTON Take 0.5 tablets (12.5 mg total) by mouth daily.   COQ-10 PO Take 1 tablet by mouth daily. Unknown strength   famotidine 40 MG tablet Commonly known as: PEPCID Take 40 mg by mouth 2 (two) times daily.   levalbuterol 45 MCG/ACT inhaler Commonly known as: XOPENEX HFA Inhale two puffs every 4-6 hours if needed for cough or wheeze.   meclizine 25 MG tablet Commonly known as: ANTIVERT Take 12.5 mg by mouth 3 (three) times daily as needed for dizziness or nausea.   metoprolol tartrate 25 MG tablet Commonly known as: LOPRESSOR Take 12.5 mg by mouth daily as needed.   NASACORT ALLERGY 24HR NA Place 1 spray into both nostrils daily.   nitroGLYCERIN 0.4 MG SL tablet Commonly known as: NITROSTAT Place 0.4 mg under the tongue every 5 (five) minutes as needed for chest pain.   Pulmicort Flexhaler 180 MCG/ACT inhaler Generic drug: budesonide INHALE 2 PUFFS INTO THE LUNGS 2 (TWO) TIMES DAILY. RINSE, GARGLE, AND SPIT AFTER USE.   TYLENOL PO Take 1 tablet by mouth as needed (pain). Unknown strenght   VITAMIN C ER PO Take 1 tablet by mouth daily at 4 PM. Unknown strenght   Vitamin D 50 MCG (2000 UT) tablet Take 2,000 Units by mouth daily.   zinc gluconate 50 MG tablet Take  50 mg by mouth daily.        Past Medical History:  Diagnosis Date   Arm pain    left   Asthma    Diabetes mellitus without complication (HCC)    GERD (gastroesophageal reflux disease)    Head pain    Hyperlipidemia    Hypertension    Neck pain    Renal insufficiency    SVT (supraventricular tachycardia) (HCC)     Past Surgical History:  Procedure Laterality Date   ABDOMINAL HYSTERECTOMY     APPENDECTOMY     CHOLECYSTECTOMY     TONSILLECTOMY      Review of systems negative except as noted in HPI / PMHx or noted below:  Review of Systems  Constitutional: Negative.   HENT: Negative.    Eyes: Negative.   Respiratory: Negative.    Cardiovascular:  Negative.   Gastrointestinal: Negative.   Genitourinary: Negative.   Musculoskeletal: Negative.   Skin: Negative.   Neurological: Negative.   Endo/Heme/Allergies: Negative.   Psychiatric/Behavioral: Negative.      Objective:   Vitals:   10/15/20 1708  BP: 108/84  Pulse: 88  Resp: 12  SpO2: 97%   Height: 5' 3.8" (162.1 cm)  Weight: 158 lb 12.8 oz (72 kg)   Physical Exam Constitutional:      Appearance: She is not diaphoretic.  HENT:     Head: Normocephalic.     Right Ear: Tympanic membrane, ear canal and external ear normal.     Left Ear: Tympanic membrane, ear canal and external ear normal.     Nose: Nose normal. No mucosal edema or rhinorrhea.     Mouth/Throat:     Pharynx: Uvula midline. No oropharyngeal exudate.  Eyes:     Conjunctiva/sclera: Conjunctivae normal.  Neck:     Thyroid: No thyromegaly.     Trachea: Trachea normal. No tracheal tenderness or tracheal deviation.  Cardiovascular:     Rate and Rhythm: Normal rate and regular rhythm.     Heart sounds: Normal heart sounds, S1 normal and S2 normal. No murmur heard. Pulmonary:     Effort: No respiratory distress.     Breath sounds: Normal breath sounds. No stridor. No wheezing or rales.  Lymphadenopathy:     Head:     Right side of head: No tonsillar adenopathy.     Left side of head: No tonsillar adenopathy.     Cervical: No cervical adenopathy.  Skin:    Findings: No erythema or rash.     Nails: There is no clubbing.  Neurological:     Mental Status: She is alert.    Diagnostics:    Spirometry was performed and demonstrated an FEV1 of 1.57 at 67 % of predicted.  Assessment and Plan:   1. Asthma, moderate persistent, well-controlled   2. Perennial allergic rhinitis   3. LPRD (laryngopharyngeal reflux disease)     1.  Continue to Treat and prevent inflammation with the following depending on disease activity:   A.  Pulmicort 180 -2 inhalations 0-7 times per week  B.  OTC Nasacort -1 spray  each nostril 0-7 times per week  2.  Continue to Treat and prevent reflux/LPR:   A.  Continue off all forms of caffeine consumption  B.  Famotidine 40 mg twice a day  3.  If needed:   A.  Xopenex HFA 2 puffs every 4-6 hours  B.  OTC antihistamine -Claritin/Zyrtec-1 time per day  4. Return to clinic in 12 months  or earlier if problem  5. Obtain fall flu vaccine  [name] is really doing very well and she has a very good understanding of her disease state and appropriate use of her medications depending on disease activity and she will continue on the plan noted above which includes anti-inflammatory agents for airway and therapy directed against reflux/LPR.  I will see her back in his clinic in 1 year or earlier if there is a problem.   [name] Schimke, MD Allergy / Immunology [facility]

## 2020-10-15 NOTE — Patient Instructions (Addendum)
  1.  Continue to Treat and prevent inflammation with the following depending on disease activity:   A.  Pulmicort 180 -2 inhalations 0-7 times per week  B.  OTC Nasacort -1 spray each nostril 0-7 times per week  2.  Continue to Treat and prevent reflux/LPR:   A.  Continue off all forms of caffeine consumption  B.  Famotidine 40 mg twice a day  3.  If needed:   A.  Xopenex HFA 2 puffs every 4-6 hours  B.  OTC antihistamine -Claritin/Zyrtec-1 time per day  4. Return to clinic in 12 months or earlier if problem  5. Obtain fall flu vaccine

## 2020-10-16 ENCOUNTER — Encounter: Payer: Self-pay | Admitting: Allergy and Immunology

## 2020-10-22 DIAGNOSIS — I10 Essential (primary) hypertension: Secondary | ICD-10-CM | POA: Diagnosis not present

## 2020-10-22 DIAGNOSIS — I471 Supraventricular tachycardia: Secondary | ICD-10-CM | POA: Diagnosis not present

## 2020-10-22 DIAGNOSIS — J4520 Mild intermittent asthma, uncomplicated: Secondary | ICD-10-CM | POA: Diagnosis not present

## 2020-11-15 DIAGNOSIS — H903 Sensorineural hearing loss, bilateral: Secondary | ICD-10-CM | POA: Diagnosis not present

## 2020-11-15 DIAGNOSIS — H43393 Other vitreous opacities, bilateral: Secondary | ICD-10-CM | POA: Diagnosis not present

## 2020-11-15 DIAGNOSIS — H9319 Tinnitus, unspecified ear: Secondary | ICD-10-CM | POA: Diagnosis not present

## 2020-11-15 DIAGNOSIS — R42 Dizziness and giddiness: Secondary | ICD-10-CM | POA: Diagnosis not present

## 2020-11-15 DIAGNOSIS — R519 Headache, unspecified: Secondary | ICD-10-CM | POA: Diagnosis not present

## 2021-01-11 DIAGNOSIS — T466X5A Adverse effect of antihyperlipidemic and antiarteriosclerotic drugs, initial encounter: Secondary | ICD-10-CM

## 2021-01-11 DIAGNOSIS — M791 Myalgia, unspecified site: Secondary | ICD-10-CM | POA: Insufficient documentation

## 2021-01-11 HISTORY — DX: Adverse effect of antihyperlipidemic and antiarteriosclerotic drugs, initial encounter: T46.6X5A

## 2021-01-24 ENCOUNTER — Other Ambulatory Visit: Payer: Self-pay | Admitting: *Deleted

## 2021-01-24 ENCOUNTER — Telehealth: Payer: Self-pay | Admitting: Allergy and Immunology

## 2021-01-24 MED ORDER — LEVALBUTEROL TARTRATE 45 MCG/ACT IN AERO: INHALATION_SPRAY | RESPIRATORY_TRACT | 1 refills | Status: DC

## 2021-01-24 MED ORDER — PULMICORT FLEXHALER 180 MCG/ACT IN AEPB: INHALATION_SPRAY | RESPIRATORY_TRACT | 8 refills | Status: DC

## 2021-01-24 NOTE — Telephone Encounter (Signed)
Patient is requesting a refill on levalbuterol and pulmicort inhaler.  Best pharmacy- [facility] on [address]

## 2021-01-24 NOTE — Telephone Encounter (Signed)
Rx sent 

## 2021-01-31 ENCOUNTER — Ambulatory Visit: Payer: BC Managed Care – PPO | Admitting: Cardiology

## 2021-02-14 ENCOUNTER — Other Ambulatory Visit: Payer: Self-pay | Admitting: Cardiology

## 2021-04-02 ENCOUNTER — Other Ambulatory Visit: Payer: Self-pay

## 2021-04-02 ENCOUNTER — Ambulatory Visit (INDEPENDENT_AMBULATORY_CARE_PROVIDER_SITE_OTHER): Payer: Medicare Other | Admitting: Cardiology

## 2021-04-02 ENCOUNTER — Encounter: Payer: Self-pay | Admitting: Cardiology

## 2021-04-02 VITALS — BP 142/72 | HR 75 | Ht 64.0 in | Wt 161.2 lb

## 2021-04-02 DIAGNOSIS — E785 Hyperlipidemia, unspecified: Secondary | ICD-10-CM | POA: Diagnosis not present

## 2021-04-02 DIAGNOSIS — R42 Dizziness and giddiness: Secondary | ICD-10-CM | POA: Diagnosis not present

## 2021-04-02 DIAGNOSIS — I10 Essential (primary) hypertension: Secondary | ICD-10-CM

## 2021-04-02 DIAGNOSIS — I471 Supraventricular tachycardia: Secondary | ICD-10-CM

## 2021-04-02 NOTE — Patient Instructions (Signed)

## 2021-04-02 NOTE — Progress Notes (Signed)
Cardiology Office Note:    Date:  04/02/2021   ID:  Dawn Perkins, DOB December 08, 1955, MRN [id number]  PCP:  Gordan Payment., MD  Cardiologist:  Gypsy Balsam, MD    Referring MD: Gordan Payment., MD   Chief Complaint  Patient presents with   Follow-up   HR elevated    History of Present Illness:    Dawn Perkins is a 66 y.o. female medical history significant for supraventricular tachycardia, essential hypertension, prediabetes, dyslipidemia.  Initially she was sent to [place] because of episode of syncope however it happened when she was sick with COVID-19 and feeling was that she was probably vagal or may be dehydrated.  She said she is feeling gradually better.  She retired and now she got plenty of time to take care of herself.  She denies having any chest pain tightness squeezing pressure burning chest but she noted fatigability as well as shortness of breath while exercising.  Past Medical History:  Diagnosis Date   Arm pain    left   Asthma    Diabetes mellitus without complication (HCC)    GERD (gastroesophageal reflux disease)    Head pain    Hyperlipidemia    Hypertension    Neck pain    Renal insufficiency    SVT (supraventricular tachycardia) (HCC)     Past Surgical History:  Procedure Laterality Date   ABDOMINAL HYSTERECTOMY     APPENDECTOMY     CHOLECYSTECTOMY     TONSILLECTOMY      Current Medications: Current Meds  Medication Sig   Acetaminophen (TYLENOL PO) Take 1 tablet by mouth as needed (pain). Unknown strenght   ALPRAZolam (XANAX) 0.25 MG tablet Take 0.25 mg by mouth at bedtime as needed for anxiety.   amLODipine (NORVASC) 5 MG tablet Take 5 mg by mouth daily.   Ascorbic Acid (VITAMIN C ER PO) Take 1 tablet by mouth daily at 4 PM. Unknown strenght   budesonide (PULMICORT) 180 MCG/ACT inhaler Inhale 2 puffs into the lungs as needed (sob/ wheezing).   chlorthalidone (HYGROTON) 25 MG tablet Take 0.5 tablets (12.5 mg total) by mouth daily.    Cholecalciferol (VITAMIN D) 2000 units tablet Take 2,000 Units by mouth daily.   Coenzyme Q10 (COQ-10 PO) Take 1 tablet by mouth daily. Unknown strength   famotidine (PEPCID) 40 MG tablet Take 40 mg by mouth 2 (two) times daily.   levalbuterol (XOPENEX HFA) 45 MCG/ACT inhaler Inhale 2 puffs into the lungs every 4 (four) hours as needed for wheezing or shortness of breath.   meclizine (ANTIVERT) 25 MG tablet Take 12.5 mg by mouth 3 (three) times daily as needed for dizziness or nausea.   nitroGLYCERIN (NITROSTAT) 0.4 MG SL tablet Place 0.4 mg under the tongue every 5 (five) minutes as needed for chest pain.   Triamcinolone Acetonide (NASACORT ALLERGY 24HR NA) Place 1 spray into both nostrils daily. Unknown strength   zinc gluconate 50 MG tablet Take 50 mg by mouth daily.     Allergies:   Effexor xr [venlafaxine hcl], Telmisartan, Esomeprazole, Esomeprazole magnesium, Losartan, Omeprazole, Omeprazole magnesium, Other, Statins, Tape, Venlafaxine, and Nexium i.v. [esomeprazole sodium]   Social History   Socioeconomic History   Marital status: Married    Spouse name: Rayna Sexton   Number of children: 2   Years of education: 12+   Highest education level: Not on file  Occupational History   Occupation: [employer] services  Tobacco Use   Smoking status: Never  Smokeless tobacco: Never  Vaping Use   Vaping Use: Never used  Substance and Sexual Activity   Alcohol use: No   Drug use: No   Sexual activity: Not on file  Other Topics Concern   Not on file  Social History Narrative   Lives with spouse   Caffeine use:  caf free diet sodas 1-1.5 daily   Social Determinants of Health   Financial Resource Strain: Not on file  Food Insecurity: Not on file  Transportation Needs: Not on file  Physical Activity: Not on file  Stress: Not on file  Social Connections: Not on file     Family History: The patient's family history includes Alzheimer's disease in her mother; Asthma in her brother;  Diabetes in her brother, brother, father, and mother; Heart disease in her brother, brother, and father; Heart failure in her brother, father, and mother; Hypertension in her brother, father, and mother; Stroke in her mother. There is no history of Neuropathy, Multiple sclerosis, Migraines, or Ataxia. ROS:   Please see the history of present illness.    All 14 point review of systems negative except as described per history of present illness  EKGs/Labs/Other Studies Reviewed:      Recent Labs: No results found for requested labs within last 8760 hours.  Recent Lipid Panel No results found for: CHOL, TRIG, HDL, CHOLHDL, VLDL, LDLCALC, LDLDIRECT  Physical Exam:    VS:  BP (!) 142/72 (BP Location: Right Arm, Patient Position: Sitting)    Pulse 75    Ht 5\' 4"  (1.626 m)    Wt 161 lb 3.2 oz (73.1 kg)    SpO2 97%    BMI 27.67 kg/m     Wt Readings from Last 3 Encounters:  04/02/21 161 lb 3.2 oz (73.1 kg)  10/15/20 158 lb 12.8 oz (72 kg)  10/05/20 158 lb (71.7 kg)     GEN:  Well nourished, well developed in no acute distress HEENT: Normal NECK: No JVD; No carotid bruits LYMPHATICS: No lymphadenopathy CARDIAC: RRR, no murmurs, no rubs, no gallops RESPIRATORY:  Clear to auscultation without rales, wheezing or rhonchi  ABDOMEN: Soft, non-tender, non-distended MUSCULOSKELETAL:  No edema; No deformity  SKIN: Warm and dry LOWER EXTREMITIES: no swelling NEUROLOGIC:  Alert and oriented x 3 PSYCHIATRIC:  Normal affect   ASSESSMENT:    1. PSVT (paroxysmal supraventricular tachycardia) (HCC)   2. Essential hypertension   3. Dyslipidemia   4. Dizziness    PLAN:    In order of problems listed above:  Supraventricular tachycardia not on any medication.  We will continue present management.  She is not bothered by that problem. Essential hypertension blood pressure elevated today however we did spend a great of time talking about the fact that she did start exercising on the regular basis  which should improve her blood pressure.  I asked her to follow-up on her blood pressure. Dyslipidemia I did review K PN which show LDL of 166 HDL 42.  This is from last year.  She is intolerant to statin.  He was initiated conversation about starting some medication for it.  However she does not want to do it yet.  And I think with her diet and exercise that we will wait and repeat her fasting lipid profile about 6 months to see how successful she is in achieving goal of cholesterol with those measures.    Medication Adjustments/Labs and Tests Ordered: Current medicines are reviewed at length with the patient today.  Concerns  regarding medicines are outlined above.  No orders of the defined types were placed in this encounter.  Medication changes: No orders of the defined types were placed in this encounter.   Signed, Georgeanna Lea, MD, [facility] 04/02/2021 9:19 AM    [facility]

## 2021-05-13 ENCOUNTER — Other Ambulatory Visit: Payer: Self-pay | Admitting: Cardiology

## 2021-05-14 NOTE — Telephone Encounter (Signed)
Chlorthalidone 25 mg # 45 x 3 refills sent to   [facility] - [place], [place] - [address]

## 2021-05-17 ENCOUNTER — Telehealth: Payer: Self-pay

## 2021-05-17 ENCOUNTER — Telehealth: Payer: Self-pay | Admitting: Cardiology

## 2021-05-17 ENCOUNTER — Encounter: Payer: Self-pay | Admitting: Cardiology

## 2021-05-17 NOTE — Telephone Encounter (Signed)
Patient set-up for an appointment on 05/23/21 with Dr. Bing Matter in the [facility] office

## 2021-05-17 NOTE — Telephone Encounter (Signed)
Pt reaching out stating that she is seeing PCV's on her machine and would like to know what Dr. Raliegh Ip recommends in regards to medicine and next steps .. please advise

## 2021-05-21 ENCOUNTER — Telehealth: Payer: Self-pay

## 2021-05-21 NOTE — Telephone Encounter (Signed)
Called patient to offer her an earlier appointment. Patient states that she feels that the problem she was having when making her appointment for 2/23 with Dr Kirtland Bouchard was more GI related and has appointment with a GI doctor and would like to cancel the appointment with cardiology at this time. Told patient if symptoms get worse or she feels its more cardiac to call office back and we can get her another appointment. Patient voices understanding and agrees

## 2021-05-23 ENCOUNTER — Ambulatory Visit: Payer: Medicare Other | Admitting: Cardiology

## 2021-07-03 DIAGNOSIS — K2960 Other gastritis without bleeding: Secondary | ICD-10-CM

## 2021-07-03 HISTORY — DX: Other gastritis without bleeding: K29.60

## 2021-07-18 DIAGNOSIS — M1A00X0 Idiopathic chronic gout, unspecified site, without tophus (tophi): Secondary | ICD-10-CM

## 2021-07-18 DIAGNOSIS — M6208 Separation of muscle (nontraumatic), other site: Secondary | ICD-10-CM | POA: Insufficient documentation

## 2021-07-18 HISTORY — DX: Separation of muscle (nontraumatic), other site: M62.08

## 2021-07-18 HISTORY — DX: Idiopathic chronic gout, unspecified site, without tophus (tophi): M1A.00X0

## 2021-07-25 DIAGNOSIS — K7581 Nonalcoholic steatohepatitis (NASH): Secondary | ICD-10-CM

## 2021-07-25 DIAGNOSIS — I700 Atherosclerosis of aorta: Secondary | ICD-10-CM

## 2021-07-25 HISTORY — DX: Atherosclerosis of aorta: I70.0

## 2021-07-25 HISTORY — DX: Nonalcoholic steatohepatitis (NASH): K75.81

## 2021-08-01 ENCOUNTER — Telehealth: Payer: Self-pay | Admitting: Cardiology

## 2021-08-01 NOTE — Telephone Encounter (Signed)
Called pt per message. She stated that she felt nervous and jittery yesterday and ran a Kardia strip. It showed NS with wide QRS on the 1st one and PVC's on the second one. Pt will send strips for Dr. Bing Matter to look at on Friday morning. Will call pt back Friday. She was agreeable. Encouraged her to go to the ER if she has any chest pain, shortness of breath or any other urgent symptoms. Pt agreed and had no other questions.  ?

## 2021-08-01 NOTE — Telephone Encounter (Signed)
Patient would like to talk with Dr. Agustin Cree or nurse. Please call back ?

## 2021-08-02 NOTE — Telephone Encounter (Signed)
Dr. Bing Matter looked at pts Dawn Perkins strips and reviewed symptoms. He stated that everything looked ok and she should call if she has any further episodes or questions. Pt agreed. She requested we get chest CT from [name] to review. ?

## 2021-08-06 ENCOUNTER — Telehealth: Payer: Self-pay

## 2021-08-06 NOTE — Telephone Encounter (Signed)
Spoke with pt. Dr. Bing Matter reviewed her CT chest from 07-10-21. He stated that her cardiovascular findings were " Not abnormal for her age." Pt was appreciative and had no other questions or concerns. ?

## 2021-08-17 ENCOUNTER — Other Ambulatory Visit: Payer: Self-pay | Admitting: Cardiology

## 2021-08-20 DIAGNOSIS — M7989 Other specified soft tissue disorders: Secondary | ICD-10-CM

## 2021-08-20 HISTORY — DX: Other specified soft tissue disorders: M79.89

## 2021-09-23 DIAGNOSIS — R202 Paresthesia of skin: Secondary | ICD-10-CM

## 2021-09-23 HISTORY — DX: Paresthesia of skin: R20.2

## 2021-10-16 ENCOUNTER — Ambulatory Visit (INDEPENDENT_AMBULATORY_CARE_PROVIDER_SITE_OTHER): Payer: Medicare Other | Admitting: Allergy and Immunology

## 2021-10-16 ENCOUNTER — Encounter: Payer: Self-pay | Admitting: Allergy and Immunology

## 2021-10-16 VITALS — BP 108/62 | HR 95 | Resp 16 | Ht 63.8 in | Wt 154.2 lb

## 2021-10-16 DIAGNOSIS — J4540 Moderate persistent asthma, uncomplicated: Secondary | ICD-10-CM

## 2021-10-16 DIAGNOSIS — K219 Gastro-esophageal reflux disease without esophagitis: Secondary | ICD-10-CM

## 2021-10-16 DIAGNOSIS — J3089 Other allergic rhinitis: Secondary | ICD-10-CM

## 2021-10-16 MED ORDER — LEVALBUTEROL TARTRATE 45 MCG/ACT IN AERO: 2.0000 | INHALATION_SPRAY | RESPIRATORY_TRACT | 1 refills | Status: DC | PRN

## 2021-10-16 MED ORDER — BUDESONIDE 180 MCG/ACT IN AEPB: INHALATION_SPRAY | RESPIRATORY_TRACT | 5 refills | Status: DC

## 2021-10-16 NOTE — Progress Notes (Signed)
[place] [facility] [place] - [place]   Follow-up Note  Referring Provider: Gordan Payment., MD Primary Provider: Gordan Payment., MD Date of Office Visit: 10/16/2021  Subjective:   Dawn Perkins (DOB: Apr 30, 1955) is a 66 y.o. female who [facility] 10[facility]/2021 in re-evaluation of the following:  HPI: Dawn Perkins returns to this clinic in evaluation of asthma, allergic rhinoconjunctivitis, LPR, and eosinophilic esophagitis.  Her last visit to this clinic was 15 October 2020.  She has really done very well regarding her asthma.  She rarely uses any short acting bronchodilator.  On most days she does not use her inhaled Pulmicort.  She has not required a systemic steroid to treat an exacerbation of asthma.  Her nose has been doing very well.  She has not required an antibiotic to treat an episode of sinusitis.  She does not use Nasacort very often.  She continues to treat her reflux with famotidine twice a day.  She apparently had a upper endoscopy performed since I have seen her in this clinic and she had bile reflux.  She has lost weight which has helped her reflux significantly.  She apparently had documentation of a fatty liver which was one of the reason that she has lost weight.  She had a problem with itching when eating tomatoes.  Apparently she eliminated consumption of tomatoes and her itching has stopped.  Allergies as of 10/16/2021       Reactions   Effexor Xr [venlafaxine Hcl]    Telmisartan Shortness Of Breath, Rash   Esomeprazole    Rash   Esomeprazole Magnesium    Rash   Losartan Other (See Comments)   High Blood Pressure   Omeprazole    rash   Omeprazole Magnesium    rash   Other    Statins Other (See Comments)   Myalgia   Tape    Venlafaxine    Trouble breathing   Nexium I.v. [esomeprazole Sodium] Rash        Medication List    ALPRAZolam 0.25 MG tablet Commonly known as: XANAX Take 0.25 mg by mouth  at bedtime as needed for anxiety.   amLODipine 5 MG tablet Commonly known as: NORVASC TAKE ONE TABLET BY MOUTH EVERY DAY   budesonide 180 MCG/ACT inhaler Commonly known as: PULMICORT 2 inhalations 0-7 times per week   chlorthalidone 25 MG tablet Commonly known as: HYGROTON Take 0.5 tablets (12.5 mg total) by mouth daily.   COQ-10 PO Take 1 tablet by mouth daily. Unknown strength   famotidine 40 MG tablet Commonly known as: PEPCID Take 40 mg by mouth 2 (two) times daily.   levalbuterol 45 MCG/ACT inhaler Commonly known as: XOPENEX HFA Inhale 2 puffs into the lungs every 4 (four) hours as needed for wheezing or shortness of breath.   meclizine 25 MG tablet Commonly known as: ANTIVERT Take 12.5 mg by mouth 3 (three) times daily as needed for dizziness or nausea.   NASACORT ALLERGY 24HR NA Place 1 spray into both nostrils daily. Unknown strength   nitroGLYCERIN 0.4 MG SL tablet Commonly known as: NITROSTAT Place 0.4 mg under the tongue every 5 (five) minutes as needed for chest pain.   TYLENOL PO Take 1 tablet by mouth as needed (pain). Unknown strenght   VITAMIN C ER PO Take 1 tablet by mouth daily at 4 PM. Unknown strenght   Vitamin D 50 MCG (2000 UT) tablet Take 2,000 Units  by mouth daily.   zinc gluconate 50 MG tablet Take 50 mg by mouth daily.    Past Medical History:  Diagnosis Date   Arm pain    left   Asthma    Diabetes mellitus without complication (HCC)    GERD (gastroesophageal reflux disease)    Head pain    Hyperlipidemia    Hypertension    Neck pain    Renal insufficiency    SVT (supraventricular tachycardia) (HCC)     Past Surgical History:  Procedure Laterality Date   ABDOMINAL HYSTERECTOMY     APPENDECTOMY     CHOLECYSTECTOMY     TONSILLECTOMY      Review of systems negative except as noted in HPI / PMHx or noted below:  Review of Systems  Constitutional: Negative.   HENT: Negative.    Eyes: Negative.   Respiratory: Negative.     Cardiovascular: Negative.   Gastrointestinal: Negative.   Genitourinary: Negative.   Musculoskeletal: Negative.   Skin: Negative.   Neurological: Negative.   Endo/Heme/Allergies: Negative.   Psychiatric/Behavioral: Negative.       Objective:   Vitals:   10/16/21 1609  BP: 108/62  Pulse: 95  Resp: 16  SpO2: 96%   Height: 5' 3.8" (162.1 cm)  Weight: 154 lb 3.2 oz (69.9 kg)   Physical Exam Constitutional:      Appearance: She is not diaphoretic.  HENT:     Head: Normocephalic.     Right Ear: Tympanic membrane, ear canal and external ear normal.     Left Ear: Tympanic membrane, ear canal and external ear normal.     Nose: Nose normal. No mucosal edema or rhinorrhea.     Mouth/Throat:     Pharynx: Uvula midline. No oropharyngeal exudate.  Eyes:     Conjunctiva/sclera: Conjunctivae normal.  Neck:     Thyroid: No thyromegaly.     Trachea: Trachea normal. No tracheal tenderness or tracheal deviation.  Cardiovascular:     Rate and Rhythm: Normal rate and regular rhythm.     Heart sounds: Normal heart sounds, S1 normal and S2 normal. No murmur heard. Pulmonary:     Effort: No respiratory distress.     Breath sounds: Normal breath sounds. No stridor. No wheezing or rales.  Lymphadenopathy:     Head:     Right side of head: No tonsillar adenopathy.     Left side of head: No tonsillar adenopathy.     Cervical: No cervical adenopathy.  Skin:    Findings: No erythema or rash.     Nails: There is no clubbing.  Neurological:     Mental Status: She is alert.     Diagnostics:    Spirometry was performed and demonstrated an FEV1 of 1.53 at 67 % of predicted.  Assessment and Plan:   1. Asthma, moderate persistent, well-controlled   2. Perennial allergic rhinitis   3. LPRD (laryngopharyngeal reflux disease)     1.  Continue to Treat and prevent inflammation with the following depending on disease activity:   A.  Pulmicort 180 -2 inhalations 0-7 times per week  2.   Continue to Treat and prevent reflux/LPR:   A.  Continue off all forms of caffeine consumption  B.  Famotidine 40 mg twice a day  3.  If needed:   A.  Xopenex HFA 2 puffs every 4-6 hours  B.  OTC antihistamine -Claritin/Zyrtec-1 time per day  C.  OTC Nasacort -1 spray each nostril 0-7 times per  week  4. Return to clinic in 12 months or earlier if problem  5. Obtain fall flu vaccine and RSV vaccine  Dawn Perkins appears to be doing quite well and is using her anti-inflammatory respiratory medications for the most part on a as needed basis and continues to consistently treat her LPR with famotidine twice a day.  She has a good understanding of her disease state and how her medications work and appropriate dosing of her medications depending on disease activity.  I will now see her back in this clinic in 1 year or earlier if there is a problem.   [name] Schimke, MD Allergy / Immunology [facility]

## 2021-10-16 NOTE — Patient Instructions (Addendum)
  1.  Continue to Treat and prevent inflammation with the following depending on disease activity:   A.  Pulmicort 180 -2 inhalations 0-7 times per week  2.  Continue to Treat and prevent reflux/LPR:   A.  Continue off all forms of caffeine consumption  B.  Famotidine 40 mg twice a day  3.  If needed:   A.  Xopenex HFA 2 puffs every 4-6 hours  B.  OTC antihistamine -Claritin/Zyrtec-1 time per day  C.  OTC Nasacort -1 spray each nostril 0-7 times per week  4. Return to clinic in 12 months or earlier if problem  5. Obtain fall flu vaccine and RSV vaccine

## 2021-10-29 ENCOUNTER — Encounter: Payer: Self-pay | Admitting: Allergy and Immunology

## 2021-11-08 ENCOUNTER — Ambulatory Visit (INDEPENDENT_AMBULATORY_CARE_PROVIDER_SITE_OTHER): Payer: Medicare Other | Admitting: Cardiology

## 2021-11-08 ENCOUNTER — Encounter: Payer: Self-pay | Admitting: Cardiology

## 2021-11-08 VITALS — BP 144/84 | HR 85 | Ht 63.8 in | Wt 152.0 lb

## 2021-11-08 DIAGNOSIS — I700 Atherosclerosis of aorta: Secondary | ICD-10-CM

## 2021-11-08 DIAGNOSIS — I6529 Occlusion and stenosis of unspecified carotid artery: Secondary | ICD-10-CM | POA: Insufficient documentation

## 2021-11-08 DIAGNOSIS — I471 Supraventricular tachycardia: Secondary | ICD-10-CM

## 2021-11-08 DIAGNOSIS — E785 Hyperlipidemia, unspecified: Secondary | ICD-10-CM

## 2021-11-08 DIAGNOSIS — K200 Eosinophilic esophagitis: Secondary | ICD-10-CM | POA: Diagnosis not present

## 2021-11-08 DIAGNOSIS — I6521 Occlusion and stenosis of right carotid artery: Secondary | ICD-10-CM | POA: Diagnosis not present

## 2021-11-08 HISTORY — DX: Occlusion and stenosis of unspecified carotid artery: I65.29

## 2021-11-08 MED ORDER — NEXLETOL 180 MG PO TABS: 180.0000 mg | ORAL_TABLET | Freq: Every day | ORAL | 6 refills | Status: DC

## 2021-11-08 MED ORDER — CLOPIDOGREL BISULFATE 75 MG PO TABS: 75.0000 mg | ORAL_TABLET | Freq: Every day | ORAL | 3 refills | Status: DC

## 2021-11-08 NOTE — Progress Notes (Unsigned)
Cardiology Office Note:    Date:  11/08/2021   ID:  Dawn Perkins, DOB 1956/02/08, MRN [id number]  PCP:  Gordan Payment., MD  Cardiologist:  Gypsy Balsam, MD    Referring MD: Gordan Payment., MD   Chief Complaint  Patient presents with   Follow-up    History of Present Illness:    Dawn Perkins is a 66 y.o. female with past medical history significant for supraventricular tachycardia, essential hypertension, prediabetes, dyslipidemia intolerant to multiple statin, she comes today to me for regular follow-up and she brought her CT of the neck for me.  She does have up to 70% narrowing in the right internal carotid artery.  She is terrified by that.  She had a family members who had a stroke and she is worried about it.  So we had a long discussion about what to do with the situation.  Luckily she does not have any TIA or CVA symptoms.  There is no palpitations there is no cardiac complaints.  Past Medical History:  Diagnosis Date   Arm pain    left   Asthma    Diabetes mellitus without complication (HCC)    GERD (gastroesophageal reflux disease)    Head pain    Hyperlipidemia    Hypertension    Neck pain    Renal insufficiency    SVT (supraventricular tachycardia) (HCC)     Past Surgical History:  Procedure Laterality Date   ABDOMINAL HYSTERECTOMY     APPENDECTOMY     CHOLECYSTECTOMY     TONSILLECTOMY      Current Medications: Current Meds  Medication Sig   Acetaminophen (TYLENOL PO) Take 1 tablet by mouth as needed (pain). Unknown strenght   ALPRAZolam (XANAX) 0.25 MG tablet Take 0.25 mg by mouth at bedtime as needed for anxiety.   amLODipine (NORVASC) 5 MG tablet TAKE ONE TABLET BY MOUTH EVERY DAY   budesonide (PULMICORT) 180 MCG/ACT inhaler 2 inhalations 0-7 times per week   chlorthalidone (HYGROTON) 25 MG tablet Take 0.5 tablets (12.5 mg total) by mouth daily.   Cholecalciferol (VITAMIN D) 2000 units tablet Take 2,000 Units by mouth daily.   Coenzyme  Q10 (COQ-10 PO) Take 1 tablet by mouth daily. Unknown strength   famotidine (PEPCID) 40 MG tablet Take 40 mg by mouth 2 (two) times daily.   levalbuterol (XOPENEX HFA) 45 MCG/ACT inhaler Inhale 2 puffs into the lungs every 4 (four) hours as needed for wheezing or shortness of breath.   meclizine (ANTIVERT) 25 MG tablet Take 12.5 mg by mouth 3 (three) times daily as needed for dizziness or nausea.   nitroGLYCERIN (NITROSTAT) 0.4 MG SL tablet Place 0.4 mg under the tongue every 5 (five) minutes as needed for chest pain.   Triamcinolone Acetonide (NASACORT ALLERGY 24HR NA) Place 1 spray into both nostrils daily. Unknown strength     Allergies:   Effexor xr [venlafaxine hcl], Telmisartan, Esomeprazole, Esomeprazole magnesium, Losartan, Nsaids, Omeprazole, Omeprazole magnesium, Other, Statins, Tape, Venlafaxine, and Nexium i.v. [esomeprazole sodium]   Social History   Socioeconomic History   Marital status: Married    Spouse name: Rayna Sexton   Number of children: 2   Years of education: 12+   Highest education level: Not on file  Occupational History   Occupation: [employer] services  Tobacco Use   Smoking status: Never   Smokeless tobacco: Never  Vaping Use   Vaping Use: Never used  Substance and Sexual Activity   Alcohol use:  No   Drug use: No   Sexual activity: Not on file  Other Topics Concern   Not on file  Social History Narrative   Lives with spouse   Caffeine use:  caf free diet sodas 1-1.5 daily   Social Determinants of Health   Financial Resource Strain: Not on file  Food Insecurity: Not on file  Transportation Needs: Not on file  Physical Activity: Not on file  Stress: Not on file  Social Connections: Not on file     Family History: The patient's family history includes Alzheimer's disease in her mother; Asthma in her brother; Diabetes in her brother, brother, father, and mother; Heart disease in her brother, brother, and father; Heart failure in her brother, father,  and mother; Hypertension in her brother, father, and mother; Stroke in her mother. There is no history of Neuropathy, Multiple sclerosis, Migraines, or Ataxia. ROS:   Please see the history of present illness.    All 14 point review of systems negative except as described per history of present illness  EKGs/Labs/Other Studies Reviewed:      Recent Labs: No results found for requested labs within last 365 days.  Recent Lipid Panel No results found for: "CHOL", "TRIG", "HDL", "CHOLHDL", "VLDL", "LDLCALC", "LDLDIRECT"  Physical Exam:    VS:  BP (!) 144/84 (BP Location: Left Arm, Patient Position: Sitting, Cuff Size: Normal)   Pulse 85   Ht 5' 3.8" (1.621 m)   Wt 152 lb (68.9 kg)   SpO2 99%   BMI 26.25 kg/m     Wt Readings from Last 3 Encounters:  11/08/21 152 lb (68.9 kg)  10/16/21 154 lb 3.2 oz (69.9 kg)  04/02/21 161 lb 3.2 oz (73.1 kg)     GEN:  Well nourished, well developed in no acute distress HEENT: Normal NECK: No JVD; No carotid bruits LYMPHATICS: No lymphadenopathy CARDIAC: RRR, no murmurs, no rubs, no gallops RESPIRATORY:  Clear to auscultation without rales, wheezing or rhonchi  ABDOMEN: Soft, non-tender, non-distended MUSCULOSKELETAL:  No edema; No deformity  SKIN: Warm and dry LOWER EXTREMITIES: no swelling NEUROLOGIC:  Alert and oriented x 3 PSYCHIATRIC:  Normal affect   ASSESSMENT:    1. Hardening of the aorta (main artery of the heart) (HCC)   2. Stenosis of right carotid artery   3. PSVT (paroxysmal supraventricular tachycardia) (HCC)   4. Esophagitis, eosinophilic   5. Dyslipidemia    PLAN:    In order of problems listed above:  Cardiac arterial disease.  Up to 70% on the right side.  I will schedule her to have carotic ultrasounds to more precisely determine degree of stenosis.  I told her if there is a 70% stenosis then there is no need to surgically intervene, however risk factors modifications need to be implemented.  I will ask her to start  taking Plavix 75 mg daily.  She had some issue with his stomach and had some problem with aspirin before that is why choice is for Plavix.  We also had a long discussion about cholesterol medication she tried very small dose of Crestor she tried pravastatin she tried different statins intolerant to it.  Therefore, I will refer her to our lipid clinic.  I will also give her Nexletol today.  I will recheck fasting lipid profile.  Her last LDL done 2 months ago by primary care physician was 125. Esophagitis that being followed by internal medicine team and GI. This edema: Plan as described above.   Medication Adjustments/Labs  and Tests Ordered: Current medicines are reviewed at length with the patient today.  Concerns regarding medicines are outlined above.  No orders of the defined types were placed in this encounter.  Medication changes: No orders of the defined types were placed in this encounter.   Signed, Georgeanna Lea, MD, [facility] 11/08/2021 10:51 AM     [facility]

## 2021-11-08 NOTE — Patient Instructions (Signed)
Medication Instructions:  Your physician has recommended you make the following change in your medication:  START: Plavix 75mg  1 tablet daily   START: Nexletol 180mg  1 tablet daily   Lab Work: None Ordered If you have labs (blood work) drawn today and your tests are completely normal, you will receive your results only by: MyChart Message (if you have MyChart) OR A paper copy in the mail If you have any lab test that is abnormal or we need to change your treatment, we will call you to review the results.   Testing/Procedures: Your physician has requested that you have a carotid duplex. This test is an ultrasound of the carotid arteries in your neck. It looks at blood flow through these arteries that supply the brain with blood. Allow one hour for this exam. There are no restrictions or special instructions.    Follow-Up: At [facility], you and your health needs are our priority.  As part of our continuing mission to provide you with exceptional heart care, we have created designated Provider Care Teams.  These Care Teams include your primary Cardiologist (physician) and Advanced Practice Providers (APPs -  Physician Assistants and Nurse Practitioners) who all work together to provide you with the care you need, when you need it.  We recommend signing up for the patient portal called "MyChart".  Sign up information is provided on this After Visit Summary.  MyChart is used to connect with patients for Virtual Visits (Telemedicine).  Patients are able to view lab/test results, encounter notes, upcoming appointments, etc.  Non-urgent messages can be sent to your provider as well.   To learn more about what you can do with MyChart, go to .    Your next appointment:   3 month(s)  The format for your next appointment:   In Person  Provider:   [facility], MD    Other Instructions Referral made to Lipid Clinic - They will call for appt

## 2021-11-19 ENCOUNTER — Telehealth: Payer: Self-pay

## 2021-11-19 NOTE — Telephone Encounter (Signed)
Prior Auth for [employer] initiated, confirmation [id number]. Sent to [facility]

## 2021-11-20 ENCOUNTER — Ambulatory Visit (INDEPENDENT_AMBULATORY_CARE_PROVIDER_SITE_OTHER): Payer: Medicare Other

## 2021-11-20 DIAGNOSIS — I6523 Occlusion and stenosis of bilateral carotid arteries: Secondary | ICD-10-CM

## 2021-11-20 DIAGNOSIS — I700 Atherosclerosis of aorta: Secondary | ICD-10-CM

## 2021-11-22 ENCOUNTER — Telehealth: Payer: Self-pay | Admitting: Cardiology

## 2021-11-22 NOTE — Telephone Encounter (Signed)
?  Pt is returning call to get carotid result ?

## 2021-11-26 ENCOUNTER — Telehealth: Payer: Self-pay

## 2021-11-26 NOTE — Telephone Encounter (Signed)
Nexletol approved confirmation# [id number]

## 2021-11-26 NOTE — Telephone Encounter (Signed)
Returned pts call regarding Carotid US results. LVM.

## 2021-12-05 ENCOUNTER — Ambulatory Visit: Payer: Medicare Other | Attending: Cardiology | Admitting: Pharmacist Clinician (PhC)/ Clinical Pharmacy Specialist

## 2021-12-05 ENCOUNTER — Encounter: Payer: Self-pay | Admitting: Pharmacist Clinician (PhC)/ Clinical Pharmacy Specialist

## 2021-12-05 ENCOUNTER — Telehealth: Payer: Self-pay

## 2021-12-05 DIAGNOSIS — E785 Hyperlipidemia, unspecified: Secondary | ICD-10-CM | POA: Insufficient documentation

## 2021-12-05 MED ORDER — PRAVASTATIN SODIUM 20 MG PO TABS: 20.0000 mg | ORAL_TABLET | Freq: Every evening | ORAL | 3 refills | Status: DC

## 2021-12-05 NOTE — Telephone Encounter (Signed)
Patient is returning call.  °

## 2021-12-05 NOTE — Telephone Encounter (Signed)
Spoke with pt about Carotid US results. She stated that she went to the Lipid Clinic today. It was decided that becaise of her increased risk for gout that she should not take Nexletol. She has been trying pravastatin 20mg  again and has been able to tolerate it. The lipid clinic plans to leave her on this recheck her cholesterol in 6 weeks and then discuss an injectable if necessary.

## 2021-12-05 NOTE — Patient Instructions (Signed)
Your Results:             Your most recent labs Goal  Total Cholesterol 196 < 200  Triglycerides 170 < 150  HDL (happy/good cholesterol) 46 > 40  LDL (lousy/bad cholesterol 125 < 70   Medication changes:  Continue with pravastatin 20 mg daily.  If you cannot tolerate this for any reason, please reach out to me.  We will plan to do cholesterol labs in about 6-8 weeks.  At that time if you are not at goal we can discuss using the Leqvio injections.        Phillips Hay PharmD Thank you for choosing [facility]

## 2021-12-05 NOTE — Assessment & Plan Note (Signed)
Patient with hyperlipidemia an ASCVD, not at cholesterol goal.  Currently re-challenging with pravastatin 20 mg daily, and has been abe to tolerate for about 3 weeks.  Reviewed options for lowering LDL cholesterol, including PCSK-9 inhibitors, bempedoic acid and inclisiran.  Discussed mechanisms of action, dosing, side effects and potential decreases in LDL cholesterol.  Also reviewed cost information and potential options for patient assistance.  Answered all patient questions.  Based on this information, patient would prefer to continue with pravastatin for another 2 months to see if any significant impact on LDL.  Stressed need to take pravastatin at night rather than in am.  If those labs don't lower LDL significantly, patient would prefer inclisiran as second option.  Signed release on file with PharmD

## 2021-12-05 NOTE — Progress Notes (Signed)
12/05/2021 Dawn Perkins 05-Apr-1955 [id number]   HPI:  Dawn Perkins is a 66 y.o. female patient of Dr Bing Matter, who presents today for a lipid clinic evaluation.  See pertinent past medical history below.  She was referred to cardiology because of syncopal episode - pt noted it occurred when she was sick with Covid and she assumed it to be vagal or dehydration.   However carotid ultrasound showed 49-60% blockage in both carotid arteries.  This has worried her some and she is actively trying to get her cholesterol numbers down.  Dr. Bing Matter gave her some samples of Nexletol to try, however when she read the drug information, she chose not to try the medication.  She has a history of elevated uric acid levels, and bempedoic acid has been known to increase risk of gout.  She tells me today that she instead decided to re-challenge with the pravastatin 20 mg, and has been taking it for 3 weeks.  States some constipation since starting, but is working with her GI MD to ease that.    Past Medical History: CAD up to 60% in both carotid arteries  Hypertension Elevated at last visit, on amlodipine, chlorthalidone  Pre-diabetes 4/23 A1c 6.0  CKD 5/23 Scr 0.94, GFR 67    Current Medications: none  Cholesterol Goals: LDL < 70   Intolerant/previously tried: pravastatin, rosuvastatin - both caused myalgias; did not start Nexletol 2/2 history of elevated uric acid levels  Family history: father had CABG x 4, died at 59, mother has CHF, stroke (left eye affected), still living at 67; oldest brother had DM1 and cardiac issues, died at 77; next brother with heart disease  pacemaker dependent, younger brother DM2; 2 daughter both healthy from heart standpoint -   Diet: history of ulcers; eats mostly chicken, has to avoid acidic foods, mostly home cooked   Exercise:  no regular exercise  (had concussion from fall during Covid - took several months to feel like herself again);   Labs: 5/23 TC  196, TG 170, HDL 46, LDL 125   Current Outpatient Medications  Medication Sig Dispense Refill   Acetaminophen (TYLENOL PO) Take 1 tablet by mouth as needed (pain). Unknown strenght     ALPRAZolam (XANAX) 0.25 MG tablet Take 0.25 mg by mouth at bedtime as needed for anxiety.     amLODipine (NORVASC) 5 MG tablet TAKE ONE TABLET BY MOUTH EVERY DAY 90 tablet 2   Bempedoic Acid (NEXLETOL) 180 MG TABS Take 180 mg by mouth daily. 30 tablet 6   budesonide (PULMICORT) 180 MCG/ACT inhaler 2 inhalations 0-7 times per week 1 each 5   chlorthalidone (HYGROTON) 25 MG tablet Take 0.5 tablets (12.5 mg total) by mouth daily. 45 tablet 3   Cholecalciferol (VITAMIN D) 2000 units tablet Take 2,000 Units by mouth daily.     clopidogrel (PLAVIX) 75 MG tablet Take 1 tablet (75 mg total) by mouth daily. 90 tablet 3   Coenzyme Q10 (COQ-10 PO) Take 1 tablet by mouth daily. Unknown strength     famotidine (PEPCID) 40 MG tablet Take 40 mg by mouth 2 (two) times daily.     levalbuterol (XOPENEX HFA) 45 MCG/ACT inhaler Inhale 2 puffs into the lungs every 4 (four) hours as needed for wheezing or shortness of breath. 1 each 1   meclizine (ANTIVERT) 25 MG tablet Take 12.5 mg by mouth 3 (three) times daily as needed for dizziness or nausea.     nitroGLYCERIN (NITROSTAT) 0.4 MG SL  tablet Place 0.4 mg under the tongue every 5 (five) minutes as needed for chest pain.     Triamcinolone Acetonide (NASACORT ALLERGY 24HR NA) Place 1 spray into both nostrils daily. Unknown strength     No current facility-administered medications for this visit.    Allergies  Allergen Reactions   Effexor Xr [Venlafaxine Hcl]    Telmisartan Shortness Of Breath and Rash   Esomeprazole     Rash   Esomeprazole Magnesium     Rash   Losartan Other (See Comments)    High Blood Pressure   Nsaids Other (See Comments)    Stomach problems   Omeprazole     rash   Omeprazole Magnesium     rash   Other    Statins Other (See Comments)    Myalgia    Tape    Venlafaxine     Trouble breathing   Nexium I.V. [Esomeprazole Sodium] Rash    Past Medical History:  Diagnosis Date   Arm pain    left   Asthma    Diabetes mellitus without complication (HCC)    GERD (gastroesophageal reflux disease)    Head pain    Hyperlipidemia    Hypertension    Neck pain    Renal insufficiency    SVT (supraventricular tachycardia) (HCC)     There were no vitals taken for this visit.   Dyslipidemia Patient with hyperlipidemia an ASCVD, not at cholesterol goal.  Currently re-challenging with pravastatin 20 mg daily, and has been abe to tolerate for about 3 weeks.  Reviewed options for lowering LDL cholesterol, including PCSK-9 inhibitors, bempedoic acid and inclisiran.  Discussed mechanisms of action, dosing, side effects and potential decreases in LDL cholesterol.  Also reviewed cost information and potential options for patient assistance.  Answered all patient questions.  Based on this information, patient would prefer to continue with pravastatin for another 2 months to see if any significant impact on LDL.  Stressed need to take pravastatin at night rather than in am.  If those labs don't lower LDL significantly, patient would prefer inclisiran as second option.  Signed release on file with PharmD   Phillips Hay PharmD CPP [facility] [address] [place], [place] [address] [phone]

## 2022-01-30 ENCOUNTER — Encounter: Payer: Self-pay | Admitting: Pharmacist Clinician (PhC)/ Clinical Pharmacy Specialist

## 2022-01-30 DIAGNOSIS — E785 Hyperlipidemia, unspecified: Secondary | ICD-10-CM

## 2022-02-11 ENCOUNTER — Ambulatory Visit: Payer: Medicare Other | Admitting: Cardiology

## 2022-03-17 ENCOUNTER — Encounter: Payer: Self-pay | Admitting: Cardiology

## 2022-03-17 ENCOUNTER — Ambulatory Visit: Payer: Medicare Other | Attending: Cardiology | Admitting: Cardiology

## 2022-03-17 VITALS — BP 110/74 | HR 78 | Ht 64.0 in | Wt 161.8 lb

## 2022-03-17 DIAGNOSIS — J4520 Mild intermittent asthma, uncomplicated: Secondary | ICD-10-CM

## 2022-03-17 DIAGNOSIS — I471 Supraventricular tachycardia, unspecified: Secondary | ICD-10-CM | POA: Diagnosis not present

## 2022-03-17 DIAGNOSIS — I10 Essential (primary) hypertension: Secondary | ICD-10-CM | POA: Diagnosis not present

## 2022-03-17 DIAGNOSIS — E785 Hyperlipidemia, unspecified: Secondary | ICD-10-CM | POA: Insufficient documentation

## 2022-03-17 DIAGNOSIS — I6521 Occlusion and stenosis of right carotid artery: Secondary | ICD-10-CM | POA: Diagnosis not present

## 2022-03-17 HISTORY — DX: Mild intermittent asthma, uncomplicated: J45.20

## 2022-03-17 NOTE — Progress Notes (Signed)
Cardiology Office Note:    Date:  03/17/2022   ID:  Dawn Perkins, DOB [birth year], MRN [id number]  PCP:  Gordan Payment., MD  Cardiologist:  Gypsy Balsam, MD    Referring MD: Gordan Payment., MD   Chief Complaint  Patient presents with   Follow-up    History of Present Illness:    Dawn Perkins is a 66 y.o. female with past medical history significant for peripheral vascular disease, she did have CT of the neck done which showed up to 70% narrowing of the right internal carotic artery, after that carotid ultrasound has been performed which showed up to 59% stenosis on both sides, likely no CVA TIA-like symptoms, additional problem include hypertension, dyslipidemia.  She comes today 2 months for follow-up overall doing well.  She denies any chest pain tightness squeezing pressure burning chest.  Overall doing well.  We had again long discussion about medication she does not want to take any statin after discussion with our lipid clinic she was offered to consider PCSK9 agent Leqvio.  Today she comes with the news that she does not want to take any of this medication she is worried about potential side effects.  I again had a long discussion with her and strongly recommend to take this medication I told her majority of people tolerate this with no difficulties.  Still she does not want to do it.  Additional issue is the fact that she did stop Plavix, she was afraid to take aspirin because of some history of GI bleed.  After long discussion she agreed to start taking back Plavix.  I told her Plavix does not make bleeding, Plavix make bleeding worse skin: 3 aspirin can provoke bleeding and make bleeding worse after this argumentation she decided to take Plavix.  Past Medical History:  Diagnosis Date   Arm pain    left   Asthma    Diabetes mellitus without complication (HCC)    GERD (gastroesophageal reflux disease)    Head pain    Hyperlipidemia    Hypertension    Neck pain     Renal insufficiency    SVT (supraventricular tachycardia)     Past Surgical History:  Procedure Laterality Date   ABDOMINAL HYSTERECTOMY     APPENDECTOMY     CHOLECYSTECTOMY     TONSILLECTOMY      Current Medications: Current Meds  Medication Sig   Acetaminophen (TYLENOL PO) Take 1 tablet by mouth as needed (pain). Unknown strenght   ALPRAZolam (XANAX) 0.25 MG tablet Take 0.25 mg by mouth at bedtime as needed for anxiety.   amLODipine (NORVASC) 5 MG tablet TAKE ONE TABLET BY MOUTH EVERY DAY (Patient taking differently: Take 5 mg by mouth daily.)   budesonide (PULMICORT) 180 MCG/ACT inhaler 2 inhalations 0-7 times per week (Patient taking differently: Inhale 2 puffs into the lungs See admin instructions. 2 inhalations 0-7 times per week)   chlorthalidone (HYGROTON) 25 MG tablet Take 0.5 tablets (12.5 mg total) by mouth daily.   Cholecalciferol (VITAMIN D) 2000 units tablet Take 2,000 Units by mouth daily.   Coenzyme Q10 (COQ-10 PO) Take 1 tablet by mouth daily. Unknown strength   famotidine (PEPCID) 40 MG tablet Take 40 mg by mouth 2 (two) times daily.   levalbuterol (XOPENEX HFA) 45 MCG/ACT inhaler Inhale 2 puffs into the lungs every 4 (four) hours as needed for wheezing or shortness of breath.   meclizine (ANTIVERT) 25 MG tablet Take 12.5 mg by mouth 3 (  three) times daily as needed for dizziness or nausea.   nitroGLYCERIN (NITROSTAT) 0.4 MG SL tablet Place 0.4 mg under the tongue every 5 (five) minutes as needed for chest pain.   Triamcinolone Acetonide (NASACORT ALLERGY 24HR NA) Place 1 spray into both nostrils daily. Unknown strength   [DISCONTINUED] clopidogrel (PLAVIX) 75 MG tablet Take 1 tablet (75 mg total) by mouth daily.   [DISCONTINUED] pravastatin (PRAVACHOL) 20 MG tablet Take 1 tablet (20 mg total) by mouth every evening.     Allergies:   Effexor xr [venlafaxine hcl], Telmisartan, Esomeprazole, Esomeprazole magnesium, Losartan, Nsaids, Omeprazole, Omeprazole magnesium,  Other, Pravastatin, Statins, Tape, Venlafaxine, and Nexium i.v. [esomeprazole sodium]   Social History   Socioeconomic History   Marital status: Married    Spouse name: Dawn Perkins   Number of children: 2   Years of education: 12+   Highest education level: Not on file  Occupational History   Occupation: [employer] services  Tobacco Use   Smoking status: Never   Smokeless tobacco: Never  Vaping Use   Vaping Use: Never used  Substance and Sexual Activity   Alcohol use: No   Drug use: No   Sexual activity: Not on file  Other Topics Concern   Not on file  Social History Narrative   Lives with spouse   Caffeine use:  caf free diet sodas 1-1.5 daily   Social Determinants of Health   Financial Resource Strain: Not on file  Food Insecurity: Not on file  Transportation Needs: Not on file  Physical Activity: Not on file  Stress: Not on file  Social Connections: Not on file     Family History: The patient's family history includes Alzheimer's disease in her mother; Asthma in her brother; Diabetes in her brother, brother, father, and mother; Heart disease in her brother, brother, and father; Heart failure in her brother, father, and mother; Hypertension in her brother, father, and mother; Stroke in her mother. There is no history of Neuropathy, Multiple sclerosis, Migraines, or Ataxia. ROS:   Please see the history of present illness.    All 14 point review of systems negative except as described per history of present illness  EKGs/Labs/Other Studies Reviewed:      Recent Labs: No results found for requested labs within last 365 days.  Recent Lipid Panel No results found for: "CHOL", "TRIG", "HDL", "CHOLHDL", "VLDL", "LDLCALC", "LDLDIRECT"  Physical Exam:    VS:  BP 110/74 (BP Location: Left Arm, Patient Position: Sitting)   Pulse 78   Ht 5\' 4"  (1.626 m)   Wt 161 lb 12.8 oz (73.4 kg)   SpO2 95%   BMI 27.77 kg/m     Wt Readings from Last 3 Encounters:  03/17/22 161 lb  12.8 oz (73.4 kg)  11/08/21 152 lb (68.9 kg)  10/16/21 154 lb 3.2 oz (69.9 kg)     GEN:  Well nourished, well developed in no acute distress HEENT: Normal NECK: No JVD; No carotid bruits LYMPHATICS: No lymphadenopathy CARDIAC: RRR, no murmurs, no rubs, no gallops RESPIRATORY:  Clear to auscultation without rales, wheezing or rhonchi  ABDOMEN: Soft, non-tender, non-distended MUSCULOSKELETAL:  No edema; No deformity  SKIN: Warm and dry LOWER EXTREMITIES: no swelling NEUROLOGIC:  Alert and oriented x 3 PSYCHIATRIC:  Normal affect   ASSESSMENT:    1. Stenosis of right carotid artery   2. Essential hypertension   3. PSVT (paroxysmal supraventricular tachycardia)   4. Dyslipidemia    PLAN:    In  order of problems listed above:  Peripheral vascular disease.  Stable from that point review.  Agrees to go on antiplatelet therapy in form of Plavix, but still does not want to take any anticholesterol medication.   Essential hypertension: Well-controlled continue present management. Dyslipidemia Long discussion, I did review note by my colleagues from lipid clinic.  She brought a message today to me that she does not want to take any of the this medication I told her about diet talk about exercises we will continue watching.   Medication Adjustments/Labs and Tests Ordered: Current medicines are reviewed at length with the patient today.  Concerns regarding medicines are outlined above.  No orders of the defined types were placed in this encounter.  Medication changes: No orders of the defined types were placed in this encounter.   Signed, Georgeanna Lea, MD, [facility] 03/17/2022 1:00 PM    [facility]

## 2022-03-17 NOTE — Patient Instructions (Signed)

## 2022-03-18 DIAGNOSIS — E538 Deficiency of other specified B group vitamins: Secondary | ICD-10-CM | POA: Insufficient documentation

## 2022-03-18 DIAGNOSIS — Z8639 Personal history of other endocrine, nutritional and metabolic disease: Secondary | ICD-10-CM | POA: Insufficient documentation

## 2022-03-18 HISTORY — DX: Deficiency of other specified B group vitamins: E53.8

## 2022-03-18 HISTORY — DX: Personal history of other endocrine, nutritional and metabolic disease: Z86.39

## 2022-04-18 ENCOUNTER — Telehealth: Payer: Self-pay | Admitting: Cardiology

## 2022-04-18 ENCOUNTER — Other Ambulatory Visit: Payer: Self-pay

## 2022-04-18 ENCOUNTER — Telehealth: Payer: Self-pay | Admitting: Allergy and Immunology

## 2022-04-18 MED ORDER — NITROGLYCERIN 0.4 MG SL SUBL: 0.4000 mg | SUBLINGUAL_TABLET | SUBLINGUAL | 11 refills | Status: DC | PRN

## 2022-04-18 MED ORDER — NITROGLYCERIN 0.4 MG SL SUBL: 0.4000 mg | SUBLINGUAL_TABLET | SUBLINGUAL | 11 refills | Status: AC | PRN

## 2022-04-18 NOTE — Telephone Encounter (Signed)
Called and spoke with [facility] and they stated that they are on backorder with Levalbuterol from their warehouse stock. They did offer to transfer the inhaler to another pharmacy for the patient but she stated that she did not want to do that. I called and spoke with the patient and advised her that you would be back in the office on Monday to provide recommendation and she was fine with that. She is not out of Levalbuterol yet but she is getting low. She stated that she started Levalbuterol because it doesn't make her heart race, she is wondering if there is a similar alternative that could be sent to [facility].

## 2022-04-18 NOTE — Telephone Encounter (Signed)
Pt's medication was sent to pt's pharmacy as requested. Confirmation received.

## 2022-04-18 NOTE — Telephone Encounter (Signed)
*  STAT* If patient is at the pharmacy, call can be transferred to refill team.   1. Which medications need to be refilled? (please list name of each medication and dose if known)   nitroGLYCERIN (NITROSTAT) 0.4 MG SL tablet   2. Which pharmacy/location (including street and city if local pharmacy) is medication to be sent to?  [place], [place]   3. Do they need a 30 day or 90 day supply? 30 day   Patient stated her medication has out of date and she will need to get a new batch.

## 2022-04-18 NOTE — Telephone Encounter (Signed)
Patient aware med sent

## 2022-04-18 NOTE — Telephone Encounter (Signed)
Ref Nitroglycerin #25 ref x 11

## 2022-04-18 NOTE — Telephone Encounter (Signed)
Pharmacy called stating medication levalbuterol [facility] HFA) 45 MCG/ACT inhaler [[id number]] is not available asking for another option please advise    Order Detail

## 2022-04-23 NOTE — Telephone Encounter (Signed)
Patient called wanting to know if there was an update on her medication.

## 2022-04-24 NOTE — Telephone Encounter (Signed)
I let Fraser Din know that we are calling around to different pharmacies to locate Xopenex or Levalbuterol for her.

## 2022-04-28 ENCOUNTER — Other Ambulatory Visit: Payer: Self-pay | Admitting: *Deleted

## 2022-04-28 MED ORDER — XOPENEX HFA 45 MCG/ACT IN AERO: INHALATION_SPRAY | RESPIRATORY_TRACT | 1 refills | Status: DC

## 2022-04-28 NOTE — Telephone Encounter (Signed)
Finally tracked down a pharmacy that has [place] in [place]. I will send Brand Xopenex to [facility] in [place]. They are only able to order the brand. Fraser Din has been informed.

## 2022-05-08 ENCOUNTER — Telehealth: Payer: Self-pay

## 2022-05-08 ENCOUNTER — Other Ambulatory Visit (HOSPITAL_COMMUNITY): Payer: Self-pay

## 2022-05-08 ENCOUNTER — Other Ambulatory Visit: Payer: Self-pay | Admitting: Cardiology

## 2022-05-08 NOTE — Telephone Encounter (Signed)
Patient Advocate Encounter   Received notification from [facility] that prior authorization is required for Xopenex [facility] 45MCG/ACT aerosol  Submitted: 05-08-2022 Key [id number]  Status is pending

## 2022-05-11 ENCOUNTER — Other Ambulatory Visit: Payer: Self-pay | Admitting: Cardiology

## 2022-05-12 NOTE — Telephone Encounter (Signed)
Rx refill sent to pharmacy. 

## 2022-05-12 NOTE — Telephone Encounter (Signed)
Patient Advocate Encounter  Prior Authorization for Xopenex HFA 45MCG/ACT aerosol has been approved through [employer].     Effective: 05-09-2022 to 03-31-2023

## 2022-07-19 ENCOUNTER — Encounter: Payer: Self-pay | Admitting: Pharmacist Clinician (PhC)/ Clinical Pharmacy Specialist

## 2022-07-28 ENCOUNTER — Telehealth: Payer: Self-pay | Admitting: Pharmacist Clinician (PhC)/ Clinical Pharmacy Specialist

## 2022-07-29 ENCOUNTER — Encounter: Payer: Self-pay | Admitting: Cardiology

## 2022-07-29 NOTE — Telephone Encounter (Signed)
Orders placed for Leqvio; applied on [employer], pt was sent email for electronic signature.

## 2022-08-01 ENCOUNTER — Telehealth: Payer: Self-pay | Admitting: Pharmacy Technician

## 2022-08-01 NOTE — Telephone Encounter (Addendum)
Dawn Perkins,  [place] note:  Patient will be scheduled as soon as possible  Auth Submission: NO AUTH NEEDED Site of care: Site of care: [facility] Payer: medicare a/b & bcbs supp Medication & CPT/J Code(s) submitted: Leqvio (Inclisiran) J1306 Route of submission (phone, fax, portal):  Phone # Fax # Auth type: Buy/Bill Units/visits requested: 2 Reference number:  Approval from: 08/01/22 to 03/31/23    Patient will assistance for maining 20% [employer] has been notified:  [employer] Status: APPROVED  Update: 09/25/22 Patient now has BCBS supp which will pick-up remaining 20%. Patient will no longer need [employer] foundation. [employer] has been notified.

## 2022-09-04 ENCOUNTER — Telehealth: Payer: Self-pay

## 2022-09-04 NOTE — Telephone Encounter (Signed)
Pt has an appt 06/25 with Dr. Bing Matter. I will forward to him.

## 2022-09-04 NOTE — Telephone Encounter (Signed)
   [facility] Pre-operative Risk Assessment    Request for surgical clearance:  What type of surgery is being performed? Colonoscopy and EGD   When is this surgery scheduled? 10/10/2022   What type of clearance is required (medical clearance vs. Pharmacy clearance to hold med vs. Both)? Pharmacy  Are there any medications that need to be held prior to surgery and how long?PLAVIX hold on 10/05/2022    Practice name and name of physician performing surgery? : Dr. Jennye Boroughs at [facility]    What is your office phone number: [phone]    7.   What is your office fax number: [phone]  8.   Anesthesia type (None, local, MAC, general) ? Not specified   Dawn Perkins 09/04/2022, 9:54 AM  _________________________________________________________________   (provider comments below)

## 2022-09-04 NOTE — Telephone Encounter (Signed)
   Name: Dawn Perkins  DOB: [birth year]  MRN: [id number]  Primary Cardiologist: Olga Millers, MD  Chart reviewed as part of pre-operative protocol coverage. Because of Darliene Cucinotta Ginley's past medical history and time since last visit, she will require a follow-up telephone visit in order to better assess preoperative cardiovascular risk.  Pre-op covering staff: - Please schedule appointment and call patient to inform them. If patient already had an upcoming appointment within acceptable timeframe, please add "pre-op clearance" to the appointment notes so provider is aware. - Please contact requesting surgeon's office via preferred method (i.e, phone, fax) to inform them of need for appointment prior to surgery.  Unsure if patient is taking Plavix since it is not listed on her medication list.  Sharlene Dory, PA-C  09/04/2022, 10:56 AM

## 2022-09-16 ENCOUNTER — Encounter: Payer: Self-pay | Admitting: Cardiology

## 2022-09-17 ENCOUNTER — Ambulatory Visit: Payer: Medicare Other | Admitting: Cardiology

## 2022-09-23 ENCOUNTER — Ambulatory Visit: Payer: Medicare Other | Attending: Cardiology | Admitting: Cardiology

## 2022-09-23 ENCOUNTER — Encounter: Payer: Self-pay | Admitting: Cardiology

## 2022-09-23 VITALS — BP 138/74 | HR 80 | Ht 64.0 in | Wt 165.4 lb

## 2022-09-23 DIAGNOSIS — I10 Essential (primary) hypertension: Secondary | ICD-10-CM | POA: Diagnosis present

## 2022-09-23 DIAGNOSIS — I471 Supraventricular tachycardia, unspecified: Secondary | ICD-10-CM | POA: Diagnosis not present

## 2022-09-23 DIAGNOSIS — I6521 Occlusion and stenosis of right carotid artery: Secondary | ICD-10-CM

## 2022-09-23 DIAGNOSIS — E785 Hyperlipidemia, unspecified: Secondary | ICD-10-CM

## 2022-09-23 DIAGNOSIS — R072 Precordial pain: Secondary | ICD-10-CM

## 2022-09-23 MED ORDER — METOPROLOL TARTRATE 100 MG PO TABS: ORAL_TABLET | ORAL | 0 refills | Status: DC

## 2022-09-23 MED ORDER — EZETIMIBE 10 MG PO TABS: 10.0000 mg | ORAL_TABLET | Freq: Every morning | ORAL | 3 refills | Status: DC

## 2022-09-23 MED ORDER — PRAVASTATIN SODIUM 20 MG PO TABS: 20.0000 mg | ORAL_TABLET | Freq: Every morning | ORAL | 3 refills | Status: DC

## 2022-09-23 NOTE — Patient Instructions (Signed)
Medication Instructions:   START: Pravastatin 20mg  1 tablet in the morning  START: Zetia 10mg  1 tablet in the morning  TAKE: Metoprolol 100mg  1 tablet 2 hours prior to CT scan  *If you need a refill on your cardiac medications before your next appointment, please call your pharmacy*   Lab Work: Your physician recommends that you return for lab work in: when lab open- at least 1 week prior to CT scan You need to have labs done when you are fasting.  You can come Monday through Friday 8:30 am to 12:00 pm and 1:15 to 4:30. You do not need to make an appointment as the order has already been placed. The labs you are going to have done are BMET   Lab Work: Your physician recommends that you return for lab work in: 6 weeks You need to have labs done when you are fasting.  You can come Monday through Friday 8:30 am to 12:00 pm and 1:15 to 4:30. You do not need to make an appointment as the order has already been placed. The labs you are going to have done are AST, ALT Lipids. .    Testing/Procedures: Your physician has requested that you have a carotid duplex. This test is an ultrasound of the carotid arteries in your neck. It looks at blood flow through these arteries that supply the brain with blood. Allow one hour for this exam. There are no restrictions or special instructions.+   Your physician has requested that you have cardiac CT. Cardiac computed tomography (CT) is a painless test that uses an x-ray machine to take clear, detailed pictures of your heart. For further information please visit [email or web address] Please follow instruction sheet as given.    Your Cardiac CT will be scheduled at:   [facility] located off [facility] at the hospital.  Please arrive 30 minutes prior to your appointment time.  You can use the FREE valet parking offered at entrance to outpatient center (encouraged to control the heart rate for the test)   Please follow these  instructions carefully (unless otherwise directed):    On the Night Before the Test: Be sure to Drink plenty of water. Do not consume any caffeinated/decaffeinated beverages or chocolate 12 hours prior to your test. Do not take any antihistamines 12 hours prior to your test.  On the Day of the Test: Drink plenty of water until 1 hour prior to the test. Do not eat any food 4 hours prior to the test. No smoking 4 hours prior to test. You may take your regular medications prior to the test.  Take metoprolol (Lopressor) two hours prior to test. HOLD Furosemide/Hydrochlorothiazide morning of the test. FEMALES- please wear underwire-free bra if available, avoid dresses & tight clothing. Wear plain shirt no beads, sparkles, rhinestones, metal or heavy embroidery.  After the Test: Drink plenty of water. After receiving IV contrast, you may experience a mild flushed feeling. This is normal. On occasion, you may experience a mild rash up to 24 hours after the test. This is not dangerous. If this occurs, you can take Benadryl 25 mg and increase your fluid intake. If you experience trouble breathing, this can be serious. If it is severe call 911 IMMEDIATELY. If it is mild, please call our office.   We will call to schedule your test 2-4 weeks out understanding that some insurance companies will need an authorization prior to the service being performed.      Follow-Up: At [facility], you and your health needs are our priority.  As part of our continuing mission to provide you with exceptional heart care, we have created designated Provider Care Teams.  These Care Teams include your primary Cardiologist (physician) and Advanced Practice Providers (APPs -  Physician Assistants and Nurse Practitioners) who all work together to provide you with the care you need, when you need it.  We recommend signing up for the patient portal called "MyChart".  Sign up information is provided on this  After Visit Summary.  MyChart is used to connect with patients for Virtual Visits (Telemedicine).  Patients are able to view lab/test results, encounter notes, upcoming appointments, etc.  Non-urgent messages can be sent to your provider as well.   To learn more about what you can do with MyChart, go to [email or web address].    Your next appointment:   6 month(s)  The format for your next appointment:   In Person  Provider:   Gypsy Balsam, MD    Other Instructions Cardiac CT Angiogram A cardiac CT angiogram is a procedure to look at the heart and the area around the heart. It may be done to help find the cause of chest pains or other symptoms of heart disease. During this procedure, a substance called contrast dye is injected into the blood vessels in the area to be checked. A large X-ray machine, called a CT scanner, then takes detailed pictures of the heart and the surrounding area. The procedure is also sometimes called a coronary CT angiogram, coronary artery scanning, or CTA. A cardiac CT angiogram allows the health care provider to see how well blood is flowing to and from the heart. The health care provider will be able to see if there are any problems, such as: Blockage or narrowing of the coronary arteries in the heart. Fluid around the heart. Signs of weakness or disease in the muscles, valves, and tissues of the heart. Tell a health care provider about: Any allergies you have. This is especially important if you have had a previous allergic reaction to contrast dye. All medicines you are taking, including vitamins, herbs, eye drops, creams, and over-the-counter medicines. Any blood disorders you have. Any surgeries you have had. Any medical conditions you have. Whether you are pregnant or may be pregnant. Any anxiety disorders, chronic pain, or other conditions you have that may increase your stress or prevent you from lying still. What are the risks? Generally, this is  a safe procedure. However, problems may occur, including: Bleeding. Infection. Allergic reactions to medicines or dyes. Damage to other structures or organs. Kidney damage from the contrast dye that is used. Increased risk of cancer from radiation exposure. This risk is low. Talk with your health care provider about: The risks and benefits of testing. How you can receive the lowest dose of radiation. What happens before the procedure? Wear comfortable clothing and remove any jewelry, glasses, dentures, and hearing aids. Follow instructions from your health care provider about eating and drinking. This may include: For 12 hours before the procedure -- avoid caffeine. This includes tea, coffee, soda, energy drinks, and diet pills. Drink plenty of water or other fluids that do not have caffeine in them. Being well hydrated can prevent complications. For 4-6 hours before the procedure -- stop eating and drinking. The contrast dye can cause nausea, but this is less likely if your stomach is empty. Ask your health care provider about changing or stopping your regular medicines. This is especially  important if you are taking diabetes medicines, blood thinners, or medicines to treat problems with erections (erectile dysfunction). What happens during the procedure?  Hair on your chest may need to be removed so that small sticky patches called electrodes can be placed on your chest. These will transmit information that helps to monitor your heart during the procedure. An IV will be inserted into one of your veins. You might be given a medicine to control your heart rate during the procedure. This will help to ensure that good images are obtained. You will be asked to lie on an exam table. This table will slide in and out of the CT machine during the procedure. Contrast dye will be injected into the IV. You might feel warm, or you may get a metallic taste in your mouth. You will be given a medicine called  nitroglycerin. This will relax or dilate the arteries in your heart. The table that you are lying on will move into the CT machine tunnel for the scan. The person running the machine will give you instructions while the scans are being done. You may be asked to: Keep your arms above your head. Hold your breath. Stay very still, even if the table is moving. When the scanning is complete, you will be moved out of the machine. The IV will be removed. The procedure may vary among health care providers and hospitals. What can I expect after the procedure? After your procedure, it is common to have: A metallic taste in your mouth from the contrast dye. A feeling of warmth. A headache from the nitroglycerin. Follow these instructions at home: Take over-the-counter and prescription medicines only as told by your health care provider. If you are told, drink enough fluid to keep your urine pale yellow. This will help to flush the contrast dye out of your body. Most people can return to their normal activities right after the procedure. Ask your health care provider what activities are safe for you. It is up to you to get the results of your procedure. Ask your health care provider, or the department that is doing the procedure, when your results will be ready. Keep all follow-up visits as told by your health care provider. This is important. Contact a health care provider if: You have any symptoms of allergy to the contrast dye. These include: Shortness of breath. Rash or hives. A racing heartbeat. Summary A cardiac CT angiogram is a procedure to look at the heart and the area around the heart. It may be done to help find the cause of chest pains or other symptoms of heart disease. During this procedure, a large X-ray machine, called a CT scanner, takes detailed pictures of the heart and the surrounding area after a contrast dye has been injected into blood vessels in the area. Ask your health care  provider about changing or stopping your regular medicines before the procedure. This is especially important if you are taking diabetes medicines, blood thinners, or medicines to treat erectile dysfunction. If you are told, drink enough fluid to keep your urine pale yellow. This will help to flush the contrast dye out of your body. This information is not intended to replace advice given to you by your health care provider. Make sure you discuss any questions you have with your health care provider. Document Revised: 07/04/2021 Document Reviewed: 11/10/2018 Elsevier Patient Education  2023 Elsevier Inc.   Important Information About Sugar

## 2022-09-23 NOTE — Addendum Note (Signed)
Addended by: Baldo Ash D on: 09/23/2022 04:32 PM   Modules accepted: Orders

## 2022-09-23 NOTE — Progress Notes (Signed)
Cardiology Office Note:    Date:  09/23/2022   ID:  Dawn Perkins, DOB May 21, 1955, MRN [id number]  PCP:  Gordan Payment., MD  Cardiologist:  Gypsy Balsam, MD    Referring MD: Gordan Payment., MD   Chief Complaint  Patient presents with   Clearance  10/10/2022    Colonoscopy/EGD Dr. Jennye Boroughs    History of Present Illness:    Dawn Perkins is a 67 y.o. female past medical history significant for peripheral vascular disease initial CT of the neck show up to 70% stenosis on the right internal carotid artery after the carotic ultrasound was performed which showed up to 59% stenosis on both sides.  Likely there is no CVA no TIA-like symptoms, additional problem include hypertension, dyslipidemia with intolerance to multiple medications.  She comes today to months for follow-up overall she is doing well.  She is scheduled to have colonoscopy endoscopy can walk climb stairs with no difficulties.  No chest pain tightness squeezing pressure burning chest  Past Medical History:  Diagnosis Date   Arm pain    left   Asthma    Diabetes mellitus without complication (HCC)    GERD (gastroesophageal reflux disease)    Head pain    Hyperlipidemia    Hypertension    Neck pain    Renal insufficiency    SVT (supraventricular tachycardia)     Past Surgical History:  Procedure Laterality Date   ABDOMINAL HYSTERECTOMY     APPENDECTOMY     CHOLECYSTECTOMY     TONSILLECTOMY      Current Medications: Current Meds  Medication Sig   Acetaminophen (TYLENOL PO) Take 1 tablet by mouth as needed (pain). Unknown strenght   ALPRAZolam (XANAX) 0.25 MG tablet Take 0.25 mg by mouth at bedtime as needed for anxiety.   amLODipine (NORVASC) 5 MG tablet Take 1 tablet (5 mg total) by mouth daily.   budesonide (PULMICORT) 180 MCG/ACT inhaler 2 inhalations 0-7 times per week (Patient taking differently: Inhale 2 puffs into the lungs See admin instructions. 2 inhalations 0-7 times per week)    chlorthalidone (HYGROTON) 25 MG tablet Take 0.5 tablets (12.5 mg total) by mouth daily.   Cholecalciferol (VITAMIN D) 2000 units tablet Take 2,000 Units by mouth daily.   clopidogrel (PLAVIX) 75 MG tablet Take 75 mg by mouth daily.   Coenzyme Q10 (COQ-10 PO) Take 1 tablet by mouth daily. Unknown strength   famotidine (PEPCID) 40 MG tablet Take 40 mg by mouth 2 (two) times daily.   meclizine (ANTIVERT) 25 MG tablet Take 12.5 mg by mouth 3 (three) times daily as needed for dizziness or nausea.   nitroGLYCERIN (NITROSTAT) 0.4 MG SL tablet Place 1 tablet (0.4 mg total) under the tongue every 5 (five) minutes as needed for chest pain.   Triamcinolone Acetonide (NASACORT ALLERGY 24HR NA) Place 1 spray into both nostrils daily. Unknown strength   XOPENEX HFA 45 MCG/ACT inhaler Inhale two puffs every 6 hours if needed for cough or wheeze.     Allergies:   Effexor xr [venlafaxine hcl], Telmisartan, Esomeprazole, Esomeprazole magnesium, Losartan, Nsaids, Omeprazole, Omeprazole magnesium, Other, Pravastatin, Statins, Tape, Venlafaxine, and Nexium i.v. [esomeprazole sodium]   Social History   Socioeconomic History   Marital status: Married    Spouse name: Dawn Perkins   Number of children: 2   Years of education: 12+   Highest education level: Not on file  Occupational History   Occupation: [employer]  Tobacco Use  Smoking status: Never   Smokeless tobacco: Never  Vaping Use   Vaping Use: Never used  Substance and Sexual Activity   Alcohol use: No   Drug use: No   Sexual activity: Not on file  Other Topics Concern   Not on file  Social History Narrative   Lives with spouse   Caffeine use:  caf free diet sodas 1-1.5 daily   Social Determinants of Health   Financial Resource Strain: Not on file  Food Insecurity: Not on file  Transportation Needs: Not on file  Physical Activity: Not on file  Stress: Not on file  Social Connections: Not on file     Family History: The patient's  family history includes Alzheimer's disease in her mother; Asthma in her brother; Diabetes in her brother, brother, father, and mother; Heart disease in her brother, brother, and father; Heart failure in her brother, father, and mother; Hypertension in her brother, father, and mother; Stroke in her mother. There is no history of Neuropathy, Multiple sclerosis, Migraines, or Ataxia. ROS:   Please see the history of present illness.    All 14 point review of systems negative except as described per history of present illness  EKGs/Labs/Other Studies Reviewed:         Recent Labs: No results found for requested labs within last 365 days.  Recent Lipid Panel No results found for: "CHOL", "TRIG", "HDL", "CHOLHDL", "VLDL", "LDLCALC", "LDLDIRECT"  Physical Exam:    VS:  BP 138/74 (BP Location: Left Arm, Patient Position: Sitting)   Pulse 80   Ht 5\' 4"  (1.626 m)   Wt 165 lb 6.4 oz (75 kg)   SpO2 98%   BMI 28.39 kg/m     Wt Readings from Last 3 Encounters:  09/23/22 165 lb 6.4 oz (75 kg)  03/17/22 161 lb 12.8 oz (73.4 kg)  11/08/21 152 lb (68.9 kg)     GEN:  Well nourished, well developed in no acute distress HEENT: Normal NECK: No JVD; No carotid bruits LYMPHATICS: No lymphadenopathy CARDIAC: RRR, no murmurs, no rubs, no gallops RESPIRATORY:  Clear to auscultation without rales, wheezing or rhonchi  ABDOMEN: Soft, non-tender, non-distended MUSCULOSKELETAL:  No edema; No deformity  SKIN: Warm and dry LOWER EXTREMITIES: no swelling NEUROLOGIC:  Alert and oriented x 3 PSYCHIATRIC:  Normal affect   ASSESSMENT:    1. PSVT (paroxysmal supraventricular tachycardia)   2. Essential hypertension   3. Stenosis of right carotid artery   4. Dyslipidemia    PLAN:    In order of problems listed above:  Peripheral vascular disease in form of carotic artery stenosis last study done more than year ago.  Will need to repeat carotic ultrasound if there is less than 79% stenosis on both  sides then we can proceed with colonoscopy endoscopy with no reservations.  In the meantime we will continue antiplatelets therapy in form of Plavix. Essential hypertension blood pressure well-controlled continue present management. Dyslipidemia we had a long discussion about this.  She is not able to take statins.  However, she was able to tolerate pravastatin 20 she was taking this initially in the morning with no difficulties somebody told her it must be taken at night started taking at night started having symptoms and cut it out she also seen our lipid clinic with consideration of PCSK9 agent versus Leqvio, she is scared to take this medication she decided not to do it.  Today after long deliberation I was able to convince her to start taking  Zetia 10 daily as well as restart her pravastatin 20 in the morning.  Will check her fasting lipid profile in the 6 weeks   Medication Adjustments/Labs and Tests Ordered: Current medicines are reviewed at length with the patient today.  Concerns regarding medicines are outlined above.  Orders Placed This Encounter  Procedures   EKG 12-Lead   Medication changes: No orders of the defined types were placed in this encounter.   Signed, Georgeanna Lea, MD, [facility] 09/23/2022 4:22 PM    [facility]

## 2022-09-23 NOTE — Addendum Note (Signed)
Addended by: Baldo Ash D on: 09/23/2022 04:55 PM   Modules accepted: Orders

## 2022-09-25 ENCOUNTER — Encounter: Payer: Self-pay | Admitting: Cardiology

## 2022-10-01 ENCOUNTER — Ambulatory Visit (HOSPITAL_COMMUNITY)
Admission: RE | Admit: 2022-10-01 | Discharge: 2022-10-01 | Disposition: A | Discharge status: Home / Self Care | Payer: Medicare Other | Source: Ambulatory Visit | Attending: Cardiovascular Disease | Admitting: Cardiovascular Disease

## 2022-10-01 ENCOUNTER — Telehealth: Payer: Self-pay

## 2022-10-01 ENCOUNTER — Telehealth: Payer: Self-pay | Admitting: Cardiology

## 2022-10-01 DIAGNOSIS — I6521 Occlusion and stenosis of right carotid artery: Secondary | ICD-10-CM | POA: Insufficient documentation

## 2022-10-01 NOTE — Telephone Encounter (Signed)
Spoke with Inetta Fermo with Dr. Camila Li office. Per Dr. Bing Matter, they can hold pts Plavix 5 days for Endoscopy and Colonoscopy. Note from last office visit faxed.

## 2022-10-01 NOTE — Telephone Encounter (Signed)
Follow Up:      Inetta Fermo called to check on the status of patient's clearance. She said they need to know about stopping the medicine today please.

## 2022-10-08 ENCOUNTER — Encounter: Payer: Self-pay | Admitting: Cardiology

## 2022-10-13 ENCOUNTER — Encounter: Payer: Self-pay | Admitting: Cardiology

## 2022-11-04 ENCOUNTER — Encounter: Payer: Self-pay | Admitting: Cardiology

## 2022-11-05 ENCOUNTER — Encounter: Payer: Self-pay | Admitting: Cardiology

## 2022-11-05 ENCOUNTER — Telehealth: Payer: Self-pay

## 2022-11-05 NOTE — Telephone Encounter (Signed)
Results reviewed with pt as per Dr. Krasowski's note.  Pt verbalized understanding and had no additional questions. Routed to PCP  

## 2022-11-18 ENCOUNTER — Telehealth: Payer: Self-pay

## 2022-11-18 DIAGNOSIS — I6521 Occlusion and stenosis of right carotid artery: Secondary | ICD-10-CM

## 2022-11-18 NOTE — Telephone Encounter (Signed)
Per Dr. Bing Matter CT angio of neck and upper chest ordered at [facility]. Sent to [facility].

## 2022-11-28 ENCOUNTER — Telehealth: Payer: Self-pay

## 2022-11-28 NOTE — Telephone Encounter (Signed)
Pt viewed results in My Chart per Dr. Krasowski's note. Routed to PCP.  

## 2022-12-15 ENCOUNTER — Encounter: Payer: Self-pay | Admitting: Cardiology

## 2022-12-19 ENCOUNTER — Encounter: Payer: Self-pay | Admitting: Cardiology

## 2022-12-23 DIAGNOSIS — I2510 Atherosclerotic heart disease of native coronary artery without angina pectoris: Secondary | ICD-10-CM | POA: Insufficient documentation

## 2022-12-23 DIAGNOSIS — K5790 Diverticulosis of intestine, part unspecified, without perforation or abscess without bleeding: Secondary | ICD-10-CM | POA: Insufficient documentation

## 2022-12-23 DIAGNOSIS — E876 Hypokalemia: Secondary | ICD-10-CM | POA: Insufficient documentation

## 2022-12-23 HISTORY — DX: Diverticulosis of intestine, part unspecified, without perforation or abscess without bleeding: K57.90

## 2022-12-23 HISTORY — DX: Atherosclerotic heart disease of native coronary artery without angina pectoris: I25.10

## 2022-12-23 HISTORY — DX: Hypokalemia: E87.6

## 2022-12-31 ENCOUNTER — Encounter: Payer: Self-pay | Admitting: Allergy and Immunology

## 2022-12-31 ENCOUNTER — Encounter: Payer: Self-pay | Admitting: Cardiology

## 2022-12-31 ENCOUNTER — Ambulatory Visit (INDEPENDENT_AMBULATORY_CARE_PROVIDER_SITE_OTHER): Payer: Medicare Other | Admitting: Allergy and Immunology

## 2022-12-31 VITALS — BP 122/76 | HR 76 | Resp 12 | Ht 64.0 in | Wt 165.2 lb

## 2022-12-31 DIAGNOSIS — K219 Gastro-esophageal reflux disease without esophagitis: Secondary | ICD-10-CM

## 2022-12-31 DIAGNOSIS — J3089 Other allergic rhinitis: Secondary | ICD-10-CM

## 2022-12-31 DIAGNOSIS — J4540 Moderate persistent asthma, uncomplicated: Secondary | ICD-10-CM

## 2022-12-31 MED ORDER — BUDESONIDE 180 MCG/ACT IN AEPB: INHALATION_SPRAY | RESPIRATORY_TRACT | 5 refills | Status: DC

## 2022-12-31 MED ORDER — XOPENEX HFA 45 MCG/ACT IN AERO: INHALATION_SPRAY | RESPIRATORY_TRACT | 1 refills | Status: DC

## 2022-12-31 NOTE — Progress Notes (Unsigned)
[place] - [place] - [place] - [place] - [place]   Follow-up Note  Referring Provider: Gordan Payment., MD Primary Provider: Gordan Payment., MD Date of Office Visit: 12/31/2022  Subjective:   Dawn Perkins (DOB: [birth year]) is a 67 y.o. female who [facility] in re-evaluation of the following:  HPI: Dawn Perkins returns to this clinic in evaluation of asthma, allergic rhinoconjunctivitis, LPR, and eosinophilic esophagitis.  I have not seen her in this clinic since 16 October 2021.  She is done pretty well with her asthma and it sounds as though she may have required 1 systemic steroid for a viral induced flare of her asthma over the course of the past year.  She intermittently uses her Pulmicort whenever she develops a flareup of her asthma.  Flareups are usually precipitated by viral triggers or cold air triggers and sometimes dust especially during the winter.  She also has an issue with her nose on occasion and she does not really use any nasal steroid preventatively.  She has had some issues with her eyes and has started some Pataday.  Her reflux appears to be doing pretty well while using famotidine.  She is now using various lipid mediators and Plavix for carotid artery stenosis.  Allergies as of 12/31/2022       Reactions   Effexor Xr [venlafaxine Hcl]    Telmisartan Shortness Of Breath, Rash   Esomeprazole    Rash   Esomeprazole Magnesium    Rash   Losartan Other (See Comments)   High Blood Pressure   Nsaids Other (See Comments)   Stomach problems   Omeprazole    rash   Omeprazole Magnesium    rash   Other    Pravastatin    Muscle aches   Statins Other (See Comments)   Myalgia   Tape    Venlafaxine    Trouble breathing   Nexium I.v. [esomeprazole Sodium] Rash        Medication List    ALPRAZolam 0.25 MG tablet Commonly known as: XANAX Take 0.25 mg by mouth at bedtime as needed for anxiety.   amLODipine  5 MG tablet Commonly known as: NORVASC Take 1 tablet (5 mg total) by mouth daily.   budesonide 180 MCG/ACT inhaler Commonly known as: PULMICORT 2 inhalations 0-7 times per week   clopidogrel 75 MG tablet Commonly known as: PLAVIX Take 75 mg by mouth daily.   COQ-10 PO Take 1 tablet by mouth daily. Unknown strength   ezetimibe 10 MG tablet Commonly known as: ZETIA Take 1 tablet (10 mg total) by mouth in the morning.   famotidine 40 MG tablet Commonly known as: PEPCID Take 40 mg by mouth 2 (two) times daily.   meclizine 25 MG tablet Commonly known as: ANTIVERT Take 12.5 mg by mouth 3 (three) times daily as needed for dizziness or nausea.   NASACORT ALLERGY 24HR NA Place 1 spray into both nostrils daily. Unknown strength   nitroGLYCERIN 0.4 MG SL tablet Commonly known as: NITROSTAT Place 1 tablet (0.4 mg total) under the tongue every 5 (five) minutes as needed for chest pain.   pravastatin 20 MG tablet Commonly known as: PRAVACHOL Take 1 tablet (20 mg total) by mouth in the morning.   TYLENOL PO Take 1 tablet by mouth as needed (pain). Unknown strenght   Vitamin D 50 MCG (2000 UT) tablet Take 2,000 Units by mouth daily.   Xopenex HFA 45 MCG/ACT inhaler  Generic drug: levalbuterol Inhale two puffs every 6 hours if needed for cough or wheeze.    Past Medical History:  Diagnosis Date   Arm pain    left   Asthma    Diabetes mellitus without complication (HCC)    GERD (gastroesophageal reflux disease)    Head pain    Hyperlipidemia    Hypertension    Neck pain    Renal insufficiency    SVT (supraventricular tachycardia) (HCC)     Past Surgical History:  Procedure Laterality Date   ABDOMINAL HYSTERECTOMY     APPENDECTOMY     CHOLECYSTECTOMY     TONSILLECTOMY      Review of systems negative except as noted in HPI / PMHx or noted below:  Review of Systems  Constitutional: Negative.   HENT: Negative.    Eyes: Negative.   Respiratory: Negative.     Cardiovascular: Negative.   Gastrointestinal: Negative.   Genitourinary: Negative.   Musculoskeletal: Negative.   Skin: Negative.   Neurological: Negative.   Endo/Heme/Allergies: Negative.   Psychiatric/Behavioral: Negative.       Objective:   Vitals:   12/31/22 1121  BP: (!) 142/88  Pulse: 76  Resp: 12  SpO2: 98%   Height: 5\' 4"  (162.6 cm)  Weight: 165 lb 3.2 oz (74.9 kg)   Physical Exam Constitutional:      Appearance: She is not diaphoretic.  HENT:     Head: Normocephalic.     Right Ear: Tympanic membrane, ear canal and external ear normal.     Left Ear: Tympanic membrane, ear canal and external ear normal.     Nose: Nose normal. No mucosal edema or rhinorrhea.     Mouth/Throat:     Pharynx: Uvula midline. No oropharyngeal exudate.  Eyes:     Conjunctiva/sclera: Conjunctivae normal.  Neck:     Thyroid: No thyromegaly.     Trachea: Trachea normal. No tracheal tenderness or tracheal deviation.  Cardiovascular:     Rate and Rhythm: Normal rate and regular rhythm.     Heart sounds: Normal heart sounds, S1 normal and S2 normal. No murmur heard. Pulmonary:     Effort: No respiratory distress.     Breath sounds: Normal breath sounds. No stridor. No wheezing or rales.  Lymphadenopathy:     Head:     Right side of head: No tonsillar adenopathy.     Left side of head: No tonsillar adenopathy.     Cervical: No cervical adenopathy.  Skin:    Findings: No erythema or rash.     Nails: There is no clubbing.  Neurological:     Mental Status: She is alert.     Diagnostics:    Spirometry was performed and demonstrated an FEV1 of 1.52 at 67 % of predicted.  Assessment and Plan:   1. Asthma, moderate persistent, well-controlled   2. Perennial allergic rhinitis   3. LPRD (laryngopharyngeal reflux disease)    1.  Treat and prevent inflammation with the following:   A.  Pulmicort 180 -2 inhalations 3-7 times per week  B.  OTC Nasacort -1 spray each nostril 3-7  times per week  2.  Continue to Treat and prevent reflux/LPR:   A.  Continue off all forms of caffeine consumption  B.  Famotidine 40 mg twice a day  C.  Pataday - 1 drop each eye 1 time per day  3.  If needed:   A.  Xopenex + Pulmicort 2 inhalations together every 4-6 hours  B.  OTC antihistamine -Claritin/Zyrtec-1 time per day   4. Return to clinic in 12 months or earlier if problem  5. Obtain fall flu vaccine   We will try Pat on a consistent dose of Pulmicort and Nasacort to address her respiratory tract issue and we will have her use an anti-inflammatory plan of rescue medicine use as noted above.  And she will also continue to address her issue with reflux with famotidine.  If she does well I will see her back in this clinic in 1 year and if not we will have her undergo a different approach to her disease states.   [name] Schimke, MD Allergy / Immunology [facility]

## 2022-12-31 NOTE — Patient Instructions (Addendum)
  1.  Treat and prevent inflammation with the following:   A.  Pulmicort 180 -2 inhalations 3-7 times per week  B.  OTC Nasacort -1 spray each nostril 3-7 times per week  2.  Continue to Treat and prevent reflux/LPR:   A.  Continue off all forms of caffeine consumption  B.  Famotidine 40 mg twice a day  C.  Pataday - 1 drop each eye 1 time per day  3.  If needed:   A.  Xopenex + Pulmicort 2 inhalations together every 4-6 hours  B.  OTC antihistamine -Claritin/Zyrtec-1 time per day   4. Return to clinic in 12 months or earlier if problem  5. Obtain fall flu vaccine

## 2023-01-01 ENCOUNTER — Encounter: Payer: Self-pay | Admitting: Allergy and Immunology

## 2023-01-06 ENCOUNTER — Other Ambulatory Visit (HOSPITAL_COMMUNITY): Payer: Self-pay

## 2023-01-06 ENCOUNTER — Telehealth: Payer: Self-pay

## 2023-01-06 NOTE — Telephone Encounter (Signed)
*  Allergy/Asthma  Pharmacy Patient Advocate Encounter   Received notification from CoverMyMeds that prior authorization for Xopenex HFA 45MCG/ACT aerosol  is required/requested.   Insurance verification completed.   The patient is insured through [facility] .   Per test claim:  Generic Levalbuterol HFA is preferred by the insurance.  If suggested medication is appropriate, Please send in a new RX and discontinue this one. If not, please advise as to why it's not appropriate so that we may request a Prior Authorization.

## 2023-01-31 ENCOUNTER — Other Ambulatory Visit: Payer: Self-pay | Admitting: Cardiology

## 2023-02-03 ENCOUNTER — Other Ambulatory Visit: Payer: Self-pay | Admitting: Cardiology

## 2023-03-26 ENCOUNTER — Encounter: Payer: Self-pay | Admitting: Cardiology

## 2023-03-26 ENCOUNTER — Ambulatory Visit: Payer: Medicare Other | Attending: Cardiology | Admitting: Cardiology

## 2023-03-26 ENCOUNTER — Other Ambulatory Visit (HOSPITAL_COMMUNITY): Payer: Self-pay

## 2023-03-26 ENCOUNTER — Telehealth: Payer: Self-pay | Admitting: Pharmacy Technician

## 2023-03-26 VITALS — BP 130/80 | HR 88 | Ht 64.0 in | Wt 166.0 lb

## 2023-03-26 DIAGNOSIS — E782 Mixed hyperlipidemia: Secondary | ICD-10-CM | POA: Diagnosis not present

## 2023-03-26 DIAGNOSIS — I2510 Atherosclerotic heart disease of native coronary artery without angina pectoris: Secondary | ICD-10-CM | POA: Diagnosis present

## 2023-03-26 DIAGNOSIS — I471 Supraventricular tachycardia, unspecified: Secondary | ICD-10-CM | POA: Insufficient documentation

## 2023-03-26 DIAGNOSIS — I6521 Occlusion and stenosis of right carotid artery: Secondary | ICD-10-CM | POA: Diagnosis not present

## 2023-03-26 NOTE — Telephone Encounter (Signed)
-----   Message from Cheree Ditto sent at 03/26/2023 12:04 PM EST ----- She has not. Please complete PA for praluent if that is what they prefer ----- Message ----- From: Art Buff, CPhT Sent: 03/26/2023  11:42 AM EST To: Cheree Ditto, RPH  Prior Authorization form/request asks a question that requires your assistance. Please see the question below and advise accordingly.  "Has the member had an inadequate response, contraindication or intolerance to the following formulary alternative: Praluent Pen?#"" ----- Message ----- From: Cheree Ditto, [facility] Sent: 03/26/2023   9:53 AM EST To: Neena Rhymes, RN; Rx Prior Auth Team  Thank you Dede we will complete PA ----- Message ----- From: Neena Rhymes, RN Sent: 03/26/2023   9:42 AM EST To: Cv Div Pharmd  Dr. Bing Matter saw pt this morning and she is unable to take the Pravastatin. She stated that she is now ready to take the Repatha. She has been seen in the Lipid Clinic previously. TY Dede

## 2023-03-26 NOTE — Progress Notes (Unsigned)
Cardiology Office Note:    Date:  03/26/2023   ID:  Dawn Perkins, DOB 05/01/1955, MRN [id number]  PCP:  Gordan Payment., MD  Cardiologist:  Gypsy Balsam, MD    Referring MD: Gordan Payment., MD   Chief Complaint  Patient presents with   Follow-up    History of Present Illness:    Dawn Perkins is a 67 y.o. female past medical history significant for peripheral vascular disease in form of carotic arterial stenosis stenosis both side between 50 and 79%, she did have coronary CT angio which showed mild disease involving left main as well as proximal LAD, dyslipidemia, intolerance to statin, diabetes, essential hypertension. Comes today to months for follow-up and discuss issue of her peripheral vascular disease and coronary artery disease.  She does not have any symptoms that would suggest obstructive disease in her heart, denies have any chest pain tightness squeezing pressure burning chest no TIA/CVA.  Discussed with involve majority of today about her cholesterol and need to take PCSK9 agent.  She did have discussion with primary care physician she is very close to make a decision to pursue it.  Past Medical History:  Diagnosis Date   Arm pain    left   Asthma    Diabetes mellitus without complication (HCC)    GERD (gastroesophageal reflux disease)    Head pain    Hyperlipidemia    Hypertension    Neck pain    Renal insufficiency    SVT (supraventricular tachycardia) (HCC)     Past Surgical History:  Procedure Laterality Date   ABDOMINAL HYSTERECTOMY     APPENDECTOMY     CHOLECYSTECTOMY     TONSILLECTOMY      Current Medications: Current Meds  Medication Sig   Acetaminophen (TYLENOL PO) Take 1 tablet by mouth as needed (pain). Unknown strenght   ALPRAZolam (XANAX) 0.25 MG tablet Take 0.25 mg by mouth at bedtime as needed for anxiety.   amLODipine (NORVASC) 5 MG tablet Take 1 tablet (5 mg total) by mouth daily.   budesonide (PULMICORT) 180 MCG/ACT inhaler  2 inhalations 0-7 times per week (Patient taking differently: Inhale 2 puffs into the lungs once a week. 2 inhalations 0-7 times per week)   Cholecalciferol (VITAMIN D) 2000 units tablet Take 2,000 Units by mouth daily.   clopidogrel (PLAVIX) 75 MG tablet Take 1 tablet (75 mg total) by mouth daily.   Coenzyme Q10 (COQ-10 PO) Take 1 tablet by mouth daily. Unknown strength   ezetimibe (ZETIA) 10 MG tablet Take 1 tablet (10 mg total) by mouth in the morning.   famotidine (PEPCID) 40 MG tablet Take 40 mg by mouth 2 (two) times daily.   meclizine (ANTIVERT) 25 MG tablet Take 12.5 mg by mouth 3 (three) times daily as needed for dizziness or nausea.   nitroGLYCERIN (NITROSTAT) 0.4 MG SL tablet Place 1 tablet (0.4 mg total) under the tongue every 5 (five) minutes as needed for chest pain.   Triamcinolone Acetonide (NASACORT ALLERGY 24HR NA) Place 1 spray into both nostrils daily. Unknown strength   XOPENEX HFA 45 MCG/ACT inhaler Inhale two puffs every 6 hours if needed for cough or wheeze. (Patient taking differently: Inhale 2 puffs into the lungs every 6 (six) hours as needed for wheezing or shortness of breath. Inhale two puffs every 6 hours if needed for cough or wheeze.)   [DISCONTINUED] pravastatin (PRAVACHOL) 20 MG tablet Take 1 tablet (20 mg total) by mouth in the morning.  Allergies:   Effexor xr [venlafaxine hcl], Telmisartan, Esomeprazole, Esomeprazole magnesium, Losartan, Nsaids, Omeprazole, Omeprazole magnesium, Other, Pravastatin, Statins, Tape, Venlafaxine, and Nexium i.v. [esomeprazole sodium]   Social History   Socioeconomic History   Marital status: Married    Spouse name: Rayna Sexton   Number of children: 2   Years of education: 12+   Highest education level: Not on file  Occupational History   Occupation: [employer]  Tobacco Use   Smoking status: Never   Smokeless tobacco: Never  Vaping Use   Vaping status: Never Used  Substance and Sexual Activity   Alcohol use: No    Drug use: No   Sexual activity: Not on file  Other Topics Concern   Not on file  Social History Narrative   Lives with spouse   Caffeine use:  caf free diet sodas 1-1.5 daily   Social Drivers of Corporate investment banker Strain: Not on file  Food Insecurity: Low Risk  (12/08/2022)   Received from [facility]   Hunger Vital Sign    Worried About Running Out of Food in the Last Year: Never true    Ran Out of Food in the Last Year: Never true  Transportation Needs: No Transportation Needs (12/08/2022)   Received from [facility]    In the past 12 months, has lack of reliable transportation kept you from medical appointments, meetings, work or from getting things needed for daily living? : No  Physical Activity: Not on file  Stress: Not on file  Social Connections: Not on file     Family History: The patient's family history includes Alzheimer's disease in her mother; Asthma in her brother; Diabetes in her brother, brother, father, and mother; Heart disease in her brother, brother, and father; Heart failure in her brother, father, and mother; Hypertension in her brother, father, and mother; Stroke in her mother. There is no history of Neuropathy, Multiple sclerosis, Migraines, or Ataxia. ROS:   Please see the history of present illness.    All 14 point review of systems negative except as described per history of present illness  EKGs/Labs/Other Studies Reviewed:         Recent Labs: 11/04/2022: BUN 14; Creatinine, Ser 0.83; Potassium 4.2; Sodium 137 11/10/2022: ALT 22  Recent Lipid Panel    Component Value Date/Time   CHOL 201 (H) 11/10/2022 0834   TRIG 141 11/10/2022 0834   HDL 50 11/10/2022 0834   CHOLHDL 4.0 11/10/2022 0834   LDLCALC 126 (H) 11/10/2022 0834    Physical Exam:    VS:  BP 130/80 (BP Location: Left Arm, Patient Position: Sitting)   Pulse 88   Ht 5\' 4"  (1.626 m)   Wt 166 lb (75.3 kg)   SpO2 94%   BMI 28.49 kg/m     Wt Readings  from Last 3 Encounters:  03/26/23 166 lb (75.3 kg)  12/31/22 165 lb 3.2 oz (74.9 kg)  09/23/22 165 lb 6.4 oz (75 kg)     GEN:  Well nourished, well developed in no acute distress HEENT: Normal NECK: No JVD; No carotid bruits LYMPHATICS: No lymphadenopathy CARDIAC: RRR, no murmurs, no rubs, no gallops RESPIRATORY:  Clear to auscultation without rales, wheezing or rhonchi  ABDOMEN: Soft, non-tender, non-distended MUSCULOSKELETAL:  No edema; No deformity  SKIN: Warm and dry LOWER EXTREMITIES: no swelling NEUROLOGIC:  Alert and oriented x 3 PSYCHIATRIC:  Normal affect   ASSESSMENT:    1. Coronary artery disease involving  native coronary artery of native heart without angina pectoris   2. PSVT (paroxysmal supraventricular tachycardia) (HCC)   3. Stenosis of right carotid artery   4. Mixed hyperlipidemia    PLAN:    In order of problems listed above:  Coronary disease stable asymptomatic.  Will continue risk factors modifications, she is on antiplatelet agent which I will continue. Dyslipidemia I did review K PN LDL 126 HDL 50 discussed with pravastatin but she is not able to continue taking that medication clearly reach criteria for PCSK9 change need to be considered.Marland Kitchen  She was reluctant but after long discussion she agreed to pursue it.  I will send a message to our lipid clinic said that she is ready to do it.  She was told that does medication only reduce cholesterol but does not improve her outcome which is incorrect statement.  Does medication due to reduced risks of coronary artery disease.  Therefore clearly she can benefit from this class of medication. Cardiac arterial stenosis we will schedule her to have another carotic ultrasound.   Medication Adjustments/Labs and Tests Ordered: Current medicines are reviewed at length with the patient today.  Concerns regarding medicines are outlined above.  No orders of the defined types were placed in this encounter.  Medication  changes: No orders of the defined types were placed in this encounter.   Signed, Georgeanna Lea, MD, [facility] 03/26/2023 9:38 AM    [facility]

## 2023-03-26 NOTE — Telephone Encounter (Signed)
Pharmacy Patient Advocate Encounter  Received notification from [facility] that Prior Authorization for praluent has been APPROVED from 03/26/23 to 03/30/2098. Ran test claim, Copay is $447.93- one month (DEDUCTIBLE). This test claim was processed through [facility]- copay amounts may vary at other pharmacies due to pharmacy/plan contracts, or as the patient moves through the different stages of their insurance plan.   PA #/Case ID/Reference #: [id number]

## 2023-03-26 NOTE — Patient Instructions (Signed)

## 2023-03-26 NOTE — Telephone Encounter (Addendum)
Pharmacy Patient Advocate Encounter   Received notification from Physician's Office that prior authorization for praluent is required/requested.   Insurance verification completed.   The patient is insured through [facility] .   Per test claim: PA required; PA submitted to above mentioned insurance via CoverMyMeds Key/confirmation #/EOC [id number] Status is pending

## 2023-03-30 NOTE — Telephone Encounter (Signed)
PA for Praluent approved. However she has a large deductible to meet (447.93)

## 2023-04-27 ENCOUNTER — Ambulatory Visit: Payer: Medicare Other | Attending: Cardiology

## 2023-04-27 DIAGNOSIS — I6521 Occlusion and stenosis of right carotid artery: Secondary | ICD-10-CM | POA: Insufficient documentation

## 2023-05-07 ENCOUNTER — Telehealth: Payer: Self-pay

## 2023-05-07 NOTE — Telephone Encounter (Signed)
-----   Message from Ralene Burger sent at 04/29/2023 10:00 AM EST ----- Carotic ultrasound show up to 59% on the right side, continue medical management

## 2023-05-27 NOTE — Telephone Encounter (Signed)
 Spoke with pt about message from Pharm D. She stated that she would talk with Dr. Bing Matter at her next appt.

## 2023-06-30 DIAGNOSIS — Z820 Family history of epilepsy and other diseases of the nervous system: Secondary | ICD-10-CM | POA: Insufficient documentation

## 2023-06-30 DIAGNOSIS — R5381 Other malaise: Secondary | ICD-10-CM | POA: Insufficient documentation

## 2023-06-30 HISTORY — DX: Family history of epilepsy and other diseases of the nervous system: Z82.0

## 2023-06-30 HISTORY — DX: Other malaise: R53.81

## 2023-07-08 DIAGNOSIS — M8589 Other specified disorders of bone density and structure, multiple sites: Secondary | ICD-10-CM | POA: Insufficient documentation

## 2023-07-08 HISTORY — DX: Other specified disorders of bone density and structure, multiple sites: M85.89

## 2023-07-16 ENCOUNTER — Other Ambulatory Visit: Payer: Self-pay | Admitting: Cardiology

## 2023-08-19 ENCOUNTER — Other Ambulatory Visit: Payer: Self-pay

## 2023-08-19 MED ORDER — XOPENEX HFA 45 MCG/ACT IN AERO: INHALATION_SPRAY | RESPIRATORY_TRACT | 1 refills | Status: DC

## 2023-08-19 MED ORDER — BUDESONIDE 180 MCG/ACT IN AEPB: INHALATION_SPRAY | RESPIRATORY_TRACT | 5 refills | Status: DC

## 2023-08-25 ENCOUNTER — Other Ambulatory Visit (HOSPITAL_COMMUNITY): Payer: Self-pay

## 2023-08-25 ENCOUNTER — Telehealth: Payer: Self-pay

## 2023-08-25 NOTE — Telephone Encounter (Signed)
 Prior Dawn Perkins has been sent to [facility].

## 2023-08-25 NOTE — Telephone Encounter (Signed)
 Approved. This drug has been approved under the Member's Medicare Part D benefit. Approved quantity: 15 units per 17 day(s). You may fill up to a 90 day supply except for those on Specialty Tier 5, which can be filled up to a 30 day supply. Please call the pharmacy to process the prescription claim.

## 2023-08-25 NOTE — Telephone Encounter (Signed)
 Okay to switch to prefer alternative Arnuity Ellipta?

## 2023-08-25 NOTE — Telephone Encounter (Signed)
*  Asthma/Allergy  Pharmacy Patient Advocate Encounter   Received notification from CoverMyMeds that prior authorization for Xopenex  HFA 45MCG/ACT aerosol  is required/requested.   Insurance verification completed.   The patient is insured through [facility] .   Per test claim:  Albuterol HFA 8.5gm/Ventolin 18gm is preferred by the insurance.  If suggested medication is appropriate, Please send in a new RX and discontinue this one. If not, please advise as to why it's not appropriate so that we may request a Prior Authorization. Please note, some preferred medications may still require a PA.  If the suggested medications have not been trialed and there are no contraindications to their use, the PA will not be submitted, as it will not be approved.   CMM Key: [id number]

## 2023-08-25 NOTE — Telephone Encounter (Signed)
*  Asthma/Allergy  Pharmacy Patient Advocate Encounter   Received notification from CoverMyMeds that prior authorization for Pulmicort  Flexhaler 180MCG/ACT aerosol powder  is required/requested.   Insurance verification completed.   The patient is insured through [facility] .   Per test claim:  Arnuity Ellipta  is preferred by the insurance.  If suggested medication is appropriate, Please send in a new RX and discontinue this one. If not, please advise as to why it's not appropriate so that we may request a Prior Authorization. Please note, some preferred medications may still require a PA.  If the suggested medications have not been trialed and there are no contraindications to their use, the PA will not be submitted, as it will not be approved.   CMM key: [id number]

## 2023-08-27 MED ORDER — ARNUITY ELLIPTA 200 MCG/ACT IN AEPB: INHALATION_SPRAY | RESPIRATORY_TRACT | 5 refills | Status: AC

## 2023-08-27 NOTE — Telephone Encounter (Signed)
**Note De-identified  [place] Obfuscation** Please advise 

## 2023-08-27 NOTE — Telephone Encounter (Signed)
 Patient informed of Chrissie's message. SEnt ERX to Prevo.Aaron Aas

## 2023-08-27 NOTE — Telephone Encounter (Signed)
 Ok to change to Arnuity 200 mcg 1 puff once a day 3-7 days a week. Rinse mouth out after.

## 2023-08-27 NOTE — Addendum Note (Signed)
 Addended by: Kenny Peals on: 08/27/2023 06:02 PM   Modules accepted: Orders

## 2023-09-12 ENCOUNTER — Other Ambulatory Visit: Payer: Self-pay | Admitting: Cardiology

## 2023-09-16 ENCOUNTER — Encounter (INDEPENDENT_AMBULATORY_CARE_PROVIDER_SITE_OTHER): Payer: Self-pay

## 2023-09-21 DIAGNOSIS — F419 Anxiety disorder, unspecified: Secondary | ICD-10-CM | POA: Insufficient documentation

## 2023-09-21 DIAGNOSIS — R29818 Other symptoms and signs involving the nervous system: Secondary | ICD-10-CM | POA: Insufficient documentation

## 2023-09-21 HISTORY — DX: Other symptoms and signs involving the nervous system: R29.818

## 2023-09-21 HISTORY — DX: Anxiety disorder, unspecified: F41.9

## 2023-09-23 ENCOUNTER — Encounter (INDEPENDENT_AMBULATORY_CARE_PROVIDER_SITE_OTHER): Payer: Self-pay

## 2023-10-26 ENCOUNTER — Encounter: Payer: Self-pay | Admitting: Cardiology

## 2023-10-26 ENCOUNTER — Ambulatory Visit: Attending: Cardiology | Admitting: Cardiology

## 2023-10-26 ENCOUNTER — Telehealth: Payer: Self-pay | Admitting: Pharmacy Technician

## 2023-10-26 ENCOUNTER — Other Ambulatory Visit (HOSPITAL_COMMUNITY): Payer: Self-pay

## 2023-10-26 VITALS — BP 122/80 | HR 86 | Ht 64.0 in | Wt 168.0 lb

## 2023-10-26 DIAGNOSIS — R0609 Other forms of dyspnea: Secondary | ICD-10-CM | POA: Diagnosis present

## 2023-10-26 DIAGNOSIS — I6523 Occlusion and stenosis of bilateral carotid arteries: Secondary | ICD-10-CM | POA: Insufficient documentation

## 2023-10-26 DIAGNOSIS — E785 Hyperlipidemia, unspecified: Secondary | ICD-10-CM | POA: Insufficient documentation

## 2023-10-26 DIAGNOSIS — I471 Supraventricular tachycardia, unspecified: Secondary | ICD-10-CM | POA: Diagnosis not present

## 2023-10-26 DIAGNOSIS — I10 Essential (primary) hypertension: Secondary | ICD-10-CM | POA: Diagnosis not present

## 2023-10-26 MED ORDER — PRALUENT 75 MG/ML ~~LOC~~ SOAJ: 75.0000 mg | SUBCUTANEOUS | 3 refills | Status: DC

## 2023-10-26 MED ORDER — REPATHA SURECLICK 140 MG/ML ~~LOC~~ SOAJ: 140.0000 mg | SUBCUTANEOUS | Status: DC

## 2023-10-26 NOTE — Patient Instructions (Signed)
 Medication Instructions:  Your physician recommends that you continue on your current medications as directed. Please refer to the Current Medication list given to you today.  *If you need a refill on your cardiac medications before your next appointment, please call your pharmacy*   Lab Work: Your physician recommends that you return for lab work in: 2 month You need to have labs done when you are fasting.  You can come Monday through Friday 8:30 am to 12:00 pm and 1:15 to 4:30. You do not need to make an appointment as the order has already been placed. The labs you are going to have done are  Lipids.    Testing/Procedures: Your physician has requested that you have an echocardiogram. Echocardiography is a painless test that uses sound waves to create images of your heart. It provides your doctor with information about the size and shape of your heart and how well your heart's chambers and valves are working. This procedure takes approximately one hour. There are no restrictions for this procedure. Please do NOT wear cologne, perfume, aftershave, or lotions (deodorant is allowed). Please arrive 15 minutes prior to your appointment time.  Please note: We ask at that you not bring children with you during ultrasound (echo/ vascular) testing. Due to room size and safety concerns, children are not allowed in the ultrasound rooms during exams. Our front office staff cannot provide observation of children in our lobby area while testing is being conducted. An adult accompanying a patient to their appointment will only be allowed in the ultrasound room at the discretion of the ultrasound technician under special circumstances. We apologize for any inconvenience.    Follow-Up: At [facility], you and your health needs are our priority.  As part of our continuing mission to provide you with exceptional heart care, we have created designated Provider Care Teams.  These Care Teams include your primary  Cardiologist (physician) and Advanced Practice Providers (APPs -  Physician Assistants and Nurse Practitioners) who all work together to provide you with the care you need, when you need it.  We recommend signing up for the patient portal called MyChart.  Sign up information is provided on this After Visit Summary.  MyChart is used to connect with patients for Virtual Visits (Telemedicine).  Patients are able to view lab/test results, encounter notes, upcoming appointments, etc.  Non-urgent messages can be sent to your provider as well.   To learn more about what you can do with MyChart, go to [email or web address].    Your next appointment:   6 month(s)  The format for your next appointment:   In Person  Provider:   Lamar Fitch, MD    Other Instructions NA

## 2023-10-26 NOTE — Telephone Encounter (Signed)
 Pharmacy Patient Advocate Encounter   Received notification from Pt Calls Messages that prior authorization for Praluent  is required/requested.   Insurance verification completed.   The patient is insured through [employer]  .   Per test claim: The current 10/26/23 day co-pay is, $116.57- one month.  No PA needed at this time. This test claim was processed through [facility]- copay amounts may vary at other pharmacies due to pharmacy/plan contracts, or as the patient moves through the different stages of their insurance plan.

## 2023-10-26 NOTE — Progress Notes (Unsigned)
 Cardiology Office Note:    Date:  10/26/2023   ID:  Dawn Perkins, DOB 08/18/1955, MRN [id number]  PCP:  Thurmond Cathlyn LABOR., MD  Cardiologist:  Lamar Fitch, MD    Referring MD: Thurmond Cathlyn LABOR., MD   Chief Complaint  Patient presents with   Follow-up    History of Present Illness:    Dawn Perkins is a 68 y.o. 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  Comes today to my office for follow-up she complained of having some palpitations more than before.  Happen when she does things sometimes when she rest.  Denies having any chest pain tightness squeezing pressure burning chest.  Past Medical History:  Diagnosis Date   Arm pain    left   Asthma    Diabetes mellitus without complication (HCC)    GERD (gastroesophageal reflux disease)    Head pain    Hyperlipidemia    Hypertension    Neck pain    Renal insufficiency    SVT (supraventricular tachycardia) (HCC)     Past Surgical History:  Procedure Laterality Date   ABDOMINAL HYSTERECTOMY     APPENDECTOMY     CHOLECYSTECTOMY     TONSILLECTOMY      Current Medications: Current Meds  Medication Sig   Acetaminophen (TYLENOL PO) Take 1 tablet by mouth as needed (pain). Unknown strenght   ALPRAZolam  (XANAX ) 0.25 MG tablet Take 0.25 mg by mouth at bedtime as needed for anxiety.   amLODipine  (NORVASC ) 5 MG tablet Take 1 tablet (5 mg total) by mouth daily.   Cholecalciferol (VITAMIN D) 2000 units tablet Take 2,000 Units by mouth daily.   clopidogrel  (PLAVIX ) 75 MG tablet Take 1 tablet (75 mg total) by mouth daily.   Coenzyme Q10 (COQ-10 PO) Take 1 tablet by mouth daily. Unknown strength   ezetimibe  (ZETIA ) 10 MG tablet  Take 1 tablet (10 mg total) by mouth in the morning.   famotidine (PEPCID) 40 MG tablet Take 40 mg by mouth 2 (two) times daily.   Fluticasone  Furoate (ARNUITY ELLIPTA ) 200 MCG/ACT AEPB Inhale one dose once daily three to seven times a week as directed.  Rinse, gargle, and spit after use.   meclizine (ANTIVERT) 25 MG tablet Take 12.5 mg by mouth 3 (three) times daily as needed for dizziness or nausea.   nitroGLYCERIN  (NITROSTAT ) 0.4 MG SL tablet Place 1 tablet (0.4 mg total) under the tongue every 5 (five) minutes as needed for chest pain.   Triamcinolone  Acetonide (NASACORT ALLERGY 24HR NA) Place 1 spray into both nostrils daily. Unknown strength   XOPENEX  HFA 45 MCG/ACT inhaler Inhale two puffs every 4-6 hours if needed for cough or wheeze.     Allergies:   Effexor xr [venlafaxine hcl], Telmisartan, Esomeprazole, Esomeprazole magnesium, Losartan, Nsaids, Omeprazole, Omeprazole magnesium, Other, Pravastatin , Statins, Tape, Venlafaxine, and Nexium i.v. [esomeprazole sodium]   Social History   Socioeconomic History   Marital status: Married    Spouse name: Dawn Perkins   Number of children: 2   Years of education: 12+   Highest education level: Not on file  Occupational History   Occupation: [employer] services  Tobacco Use   Smoking status: Never   Smokeless tobacco: Never  Vaping  Use   Vaping status: Never Used  Substance and Sexual Activity   Alcohol use: No   Drug use: No   Sexual activity: Not on file  Other Topics Concern   Not on file  Social History Narrative   Lives with spouse   Caffeine use:  caf free diet sodas 1-1.5 daily   Social Drivers of Corporate investment banker Strain: Not on file  Food Insecurity: Low Risk  (12/08/2022)   Received from [facility]   Hunger Vital Sign    Within the past 12 months, you worried that your food would run out before you got money to buy more: Never true    Within the past 12 months, the food you bought just didn't last and you  didn't have money to get more. : Never true  Transportation Needs: No Transportation Needs (12/08/2022)   Received from [facility]    In the past 12 months, has lack of reliable transportation kept you from medical appointments, meetings, work or from getting things needed for daily living? : No  Physical Activity: Not on file  Stress: Not on file  Social Connections: Not on file     Family History: The patient's family history includes Alzheimer's disease in her mother; Asthma in her brother; Diabetes in her brother, brother, father, and mother; Heart disease in her brother, brother, and father; Heart failure in her brother, father, and mother; Hypertension in her brother, father, and mother; Stroke in her mother. There is no history of Neuropathy, Multiple sclerosis, Migraines, or Ataxia. ROS:   Please see the history of present illness.    All 14 point review of systems negative except as described per history of present illness  EKGs/Labs/Other Studies Reviewed:    EKG Interpretation Date/Time:  Monday October 26 2023 14:37:50 EDT Ventricular Rate:  86 PR Interval:  134 QRS Duration:  74 QT Interval:  378 QTC Calculation: 452 R Axis:   81  Text Interpretation: Sinus rhythm with occasional Premature ventricular complexes Otherwise normal ECG When compared with ECG of 08-May-2015 12:58, PREVIOUS ECG IS PRESENT Confirmed by Bernie Charleston [phone]) on 10/26/2023 2:56:24 PM    Recent Labs: 11/04/2022: BUN 14; Creatinine, Ser 0.83; Potassium 4.2; Sodium 137 11/10/2022: ALT 22  Recent Lipid Panel    Component Value Date/Time   CHOL 201 (H) 11/10/2022 0834   TRIG 141 11/10/2022 0834   HDL 50 11/10/2022 0834   CHOLHDL 4.0 11/10/2022 0834   LDLCALC 126 (H) 11/10/2022 0834    Physical Exam:    VS:  BP 122/80 (BP Location: Left Arm, Patient Position: Sitting)   Pulse 86   Ht 5' 4 (1.626 m)   Wt 168 lb (76.2 kg)   SpO2 96%   BMI 28.84 kg/m     Wt Readings  from Last 3 Encounters:  10/26/23 168 lb (76.2 kg)  03/26/23 166 lb (75.3 kg)  12/31/22 165 lb 3.2 oz (74.9 kg)     GEN:  Well nourished, well developed in no acute distress HEENT: Normal NECK: No JVD; No carotid bruits LYMPHATICS: No lymphadenopathy CARDIAC: RRR, no murmurs, no rubs, no gallops RESPIRATORY:  Clear to auscultation without rales, wheezing or rhonchi  ABDOMEN: Soft, non-tender, non-distended MUSCULOSKELETAL:  No edema; No deformity  SKIN: Warm and dry LOWER EXTREMITIES: no swelling NEUROLOGIC:  Alert and oriented x 3 PSYCHIATRIC:  Normal affect   ASSESSMENT:    1. Essential hypertension   2. Atherosclerosis of both carotid  arteries   3. PSVT (paroxysmal supraventricular tachycardia) (HCC)   4. Dyslipidemia    PLAN:    In order of problems listed above:  Atherosclerosis with coronary artery disease as well as coronary artery disease.  Again long discussion with her about potentially taking PCSK9 agent finally she agrees I will try to give her first shot today.  Then will follow-up on fasting lipid profile. Palpitations ask her to do echocardiogram to make sure she does not have any cardiomyopathy that either provoke or is resulting from extrasystole. Dyslipidemia plan as described above we will initiate PCSK9 agent. Supraventricular tachycardia.  I will try to check echocardiogram and then decide what to do with this arrhythmia.   Medication Adjustments/Labs and Tests Ordered: Current medicines are reviewed at length with the patient today.  Concerns regarding medicines are outlined above.  Orders Placed This Encounter  Procedures   EKG 12-Lead   Medication changes: No orders of the defined types were placed in this encounter.   Signed, Lamar DOROTHA Fitch, MD, [facility] 10/26/2023 3:13 PM    [facility]

## 2023-10-27 ENCOUNTER — Encounter: Payer: Self-pay | Admitting: Cardiology

## 2023-10-27 ENCOUNTER — Other Ambulatory Visit (HOSPITAL_COMMUNITY): Payer: Self-pay

## 2023-10-27 ENCOUNTER — Telehealth: Payer: Self-pay | Admitting: Pharmacy Technician

## 2023-10-27 NOTE — Telephone Encounter (Signed)
 Patient Advocate Encounter   The patient was approved for a [employer] grant that will help cover the cost of Praluent  Total amount awarded, 2500.00.  Effective: 09/27/23 - 09/25/24   APW:[id number] ERW:[id number] Hmnle:[id number] PI:[id number]  [employer] ID: [id number]   Pharmacy provided with approval and processing information. Patient informed via mychart

## 2023-10-28 ENCOUNTER — Telehealth: Payer: Self-pay | Admitting: Cardiology

## 2023-10-28 ENCOUNTER — Other Ambulatory Visit (HOSPITAL_BASED_OUTPATIENT_CLINIC_OR_DEPARTMENT_OTHER): Payer: Self-pay

## 2023-10-28 MED ORDER — PRALUENT 75 MG/ML ~~LOC~~ SOAJ
75.0000 mg | SUBCUTANEOUS | 3 refills | Status: DC
  Filled 2023-10-28: qty 6, 84d supply, fill #0

## 2023-10-28 NOTE — Telephone Encounter (Signed)
 Pt aware that RX is instock at [facility]. RX has been sent.

## 2023-10-28 NOTE — Telephone Encounter (Signed)
 Pt c/o medication issue:  1. Name of Medication:   Alirocumab  (PRALUENT ) 75 MG/ML SOAJ   2. How are you currently taking this medication (dosage and times per day)?   3. Are you having a reaction (difficulty breathing--STAT)?   4. What is your medication issue?   Caller Jefrey) stated they do not carry this medication and the [facility], [facility] and [facility] in [place] also do not carry the medication.  Caller wants advice on alternate medication.

## 2023-10-29 ENCOUNTER — Other Ambulatory Visit (HOSPITAL_BASED_OUTPATIENT_CLINIC_OR_DEPARTMENT_OTHER): Payer: Self-pay

## 2023-11-12 ENCOUNTER — Ambulatory Visit: Attending: Cardiology

## 2023-11-12 DIAGNOSIS — R0609 Other forms of dyspnea: Secondary | ICD-10-CM | POA: Diagnosis present

## 2023-11-13 LAB — ECHOCARDIOGRAM COMPLETE
AR max vel: 1.79 cm2
AV Area VTI: 1.97 cm2
AV Area mean vel: 1.84 cm2
AV Mean grad: 7 mmHg
AV Peak grad: 13.4 mmHg
Ao pk vel: 1.83 m/s
Area-P 1/2: 3.68 cm2
S' Lateral: 2.4 cm

## 2023-11-17 ENCOUNTER — Ambulatory Visit: Payer: Self-pay | Admitting: Cardiology

## 2023-11-20 ENCOUNTER — Telehealth: Payer: Self-pay

## 2023-11-20 NOTE — Telephone Encounter (Signed)
 Left message on My Chart with normal results per Dr. Karry note. Routed to PCP.

## 2023-11-27 ENCOUNTER — Ambulatory Visit (INDEPENDENT_AMBULATORY_CARE_PROVIDER_SITE_OTHER): Admitting: Audiology

## 2023-11-27 ENCOUNTER — Ambulatory Visit (INDEPENDENT_AMBULATORY_CARE_PROVIDER_SITE_OTHER): Admitting: Otolaryngology

## 2023-11-27 ENCOUNTER — Encounter (INDEPENDENT_AMBULATORY_CARE_PROVIDER_SITE_OTHER): Payer: Self-pay | Admitting: Otolaryngology

## 2023-11-27 VITALS — BP 145/83 | HR 83 | Ht 64.0 in | Wt 165.0 lb

## 2023-11-27 DIAGNOSIS — H6993 Unspecified Eustachian tube disorder, bilateral: Secondary | ICD-10-CM | POA: Diagnosis not present

## 2023-11-27 DIAGNOSIS — H903 Sensorineural hearing loss, bilateral: Secondary | ICD-10-CM

## 2023-11-27 DIAGNOSIS — H608X3 Other otitis externa, bilateral: Secondary | ICD-10-CM

## 2023-11-27 DIAGNOSIS — K118 Other diseases of salivary glands: Secondary | ICD-10-CM | POA: Diagnosis not present

## 2023-11-27 DIAGNOSIS — H9313 Tinnitus, bilateral: Secondary | ICD-10-CM

## 2023-11-27 MED ORDER — FLUOCINOLONE ACETONIDE 0.01 % OT OIL: 4.0000 [drp] | TOPICAL_OIL | Freq: Two times a day (BID) | OTIC | 2 refills | Status: AC

## 2023-11-27 MED ORDER — BETAMETHASONE DIPROPIONATE 0.05 % EX CREA: TOPICAL_CREAM | Freq: Two times a day (BID) | CUTANEOUS | 0 refills | Status: AC

## 2023-11-27 NOTE — Progress Notes (Signed)
  [address] [place], [place] [address] [phone]  Audiological Evaluation    Name: Dawn Perkins     DOB:   21-Nov-1955      MRN:   [id number]                                                                                     Service Date: 11/27/2023     Accompanied by: unaccompanied   Patient comes today after Dr. Tobie, ENT sent a referral for a hearing evaluation due to concerns with hearing loss and tinnitus.   Symptoms Yes Details  Hearing loss  []    Tinnitus  [x]  Patient reports tinnitus and uses a sound machine to help reduce its perception at night.  Ear pain/ infections/pressure  []    Balance problems  []    Noise exposure history  []    Previous ear surgeries  []    Family history of hearing loss  []    Amplification  []    Other  []      Otoscopy: Right ear: Clear external ear canal and notable landmarks visualized on the tympanic membrane. Left ear:  Clear external ear canal and notable landmarks visualized on the tympanic membrane.  Tympanometry: Right ear: Type A- Normal external ear canal volume with normal middle ear pressure and tympanic membrane compliance. Left ear: Type A- Normal external ear canal volume with normal middle ear pressure and tympanic membrane compliance.    Pure tone Audiometry: Both ears- Normal to moderate sensorineural hearing loss from 250 Hz - 8000 Hz.  Speech Audiometry: Right ear- Speech Reception Threshold (SRT) was obtained at 15 dBHL. Left ear-Speech Reception Threshold (SRT) was obtained at 15 dBHL.   Word Recognition Score Tested using NU-6 (recorded) Right ear: 100% was obtained at a presentation level of 60 dBHL with contralateral masking which is deemed as  excellent. Left ear: 100% was obtained at a presentation level of 60 dBHL with contralateral masking which is deemed as  excellent.   The hearing test results were completed under headphones and results are deemed to be of good reliability. Test  technique:  conventional      Recommendations: Follow up with ENT as scheduled for today. Return for a hearing evaluation in 2 -3 years, before if concerns with hearing changes arise or per MD recommendation.   Niccolas Loeper MARIE LEROUX-MARTINEZ, AUD

## 2023-11-27 NOTE — Progress Notes (Signed)
 Dear Dr. Jackolyn, Here is my assessment for our mutual patient, Dawn Perkins. Thank you for allowing me the opportunity to care for your patient. Please do not hesitate to contact me should you have any other questions. Sincerely, Dr. Eldora Blanch  Otolaryngology Clinic Note Referring provider: Dr. Jackolyn HPI:  Dawn Perkins is a 68 y.o. female kindly referred by Dr. Jackolyn for evaluation of parotid nodules  Initial visit (10/2023): She has had parotid nodules since at least 2018, and incidentally found on Head CT. Multiple images for various reasons since 2020, appear relatively stable in size. Drained the nodules in 2023 and path noted to be benign. She is overall relatively asymptomatic except has felt some parotid fullness on the left. No episodes of parotitis, UTD on vaccines. No pain with eating. No skin cancers. Does not smoke.    Patient otherwise denies: - dysphagia, odynophagia, unintentional weight loss - changes in voice, shortness of breath, hemoptysis - ear pain, neck masses  Patient reports: bilateral tinnitus and hearing loss, ongoing . Intermittent lightheadedness/imbalance mostly in morning. Takes meclizine rarely, as needed. Ears do itch bilaterally. Does have intermittent AR symptoms with intermittent fullness. Patient denies: ear pain, drainage, tinnitus Patient also denies barotrauma, vestibular suppressant use, ototoxic medication use Prior ear surgery: no  Personal or FHx of bleeding dz or anesthesia difficulty: no  AP/AC: Plavix   Tobacco: no  PMHx: OSA, Pocythemia, GAD, Insomnia, Venous Stasis, Coronary Atherosclerosis, HTN, HASH, Osteopenia, GERD (s/p prior esophageal dilatation), PSVT  Independent Review of Additional Tests or Records:  Dr. Thurmond notes 09/21/2023: noted parotid nodules, prior worked up with Dr. Honor; observed prior, some intervention in the past(?); Has OSA; Dx: Parotid Nodules, Rx: ref to ENT Labs CBC and CMP 09/21/2023: WBC 4; Hgb 14.6, Plt  287; BUN/Cr 15/0.81 MRI 10/05/2018 independently interpreted: noted <1cm 3 left parotid cysts; no other noted significant lymphadenopathy; small left max likely mucous retention cyst CT Neck 08/19/2023 independently interpreted: multiple scattered left parotid nodules approximately same size as MRI in 2020; query small parotid nodules on left as well; no noted necrotic LAD or other lesions appreciated Also has had US  Parotid 2023 - noted 3 parotid nodules, approx same in size 10/2023 Audiogram was independently reviewed and interpreted by me and it reveals B/l normal downsloping to mod SNHL; WRT 100% AU at 60dB AU; A/A tymps  SNHL= Sensorineural hearing loss  PMH/Meds/All/SocHx/FamHx/ROS:   Past Medical History:  Diagnosis Date   Arm pain    left   Asthma    Diabetes mellitus without complication (HCC)    GERD (gastroesophageal reflux disease)    Head pain    Hyperlipidemia    Hypertension    Neck pain    Renal insufficiency    SVT (supraventricular tachycardia) (HCC)      Past Surgical History:  Procedure Laterality Date   ABDOMINAL HYSTERECTOMY     APPENDECTOMY     CHOLECYSTECTOMY     TONSILLECTOMY      Family History  Problem Relation Age of Onset   Diabetes Mother    Stroke Mother    Hypertension Mother    Alzheimer's disease Mother    Heart failure Mother    Heart failure Father    Heart disease Father    Diabetes Father    Hypertension Father    Heart failure Brother    Hypertension Brother    Heart disease Brother    Diabetes Brother    Heart disease Brother    Diabetes Brother  Asthma Brother    Neuropathy Neg Hx    Multiple sclerosis Neg Hx    Migraines Neg Hx    Ataxia Neg Hx      Social Connections: Not on file      Current Outpatient Medications:    Acetaminophen (TYLENOL PO), Take 1 tablet by mouth as needed (pain). Unknown strenght, Disp: , Rfl:    Alirocumab  (PRALUENT ) 75 MG/ML SOAJ, Inject 1 mL (75 mg total) into the skin every 14  (fourteen) days., Disp: 6 mL, Rfl: 3   ALPRAZolam  (XANAX ) 0.25 MG tablet, Take 0.25 mg by mouth at bedtime as needed for anxiety., Disp: , Rfl:    amLODipine  (NORVASC ) 5 MG tablet, Take 1 tablet (5 mg total) by mouth daily., Disp: 90 tablet, Rfl: 2   betamethasone  dipropionate 0.05 % cream, Apply topically 2 (two) times daily., Disp: 30 g, Rfl: 0   Cholecalciferol (VITAMIN D) 2000 units tablet, Take 2,000 Units by mouth daily., Disp: , Rfl:    clopidogrel  (PLAVIX ) 75 MG tablet, Take 1 tablet (75 mg total) by mouth daily., Disp: 90 tablet, Rfl: 3   Coenzyme Q10 (COQ-10 PO), Take 1 tablet by mouth daily. Unknown strength, Disp: , Rfl:    ezetimibe  (ZETIA ) 10 MG tablet, Take 1 tablet (10 mg total) by mouth in the morning., Disp: 90 tablet, Rfl: 1   famotidine (PEPCID) 40 MG tablet, Take 40 mg by mouth 2 (two) times daily., Disp: , Rfl:    Fluocinolone  Acetonide (DERMOTIC ) 0.01 % OIL, Place 4 drops in ear(s) in the morning and at bedtime for 14 days., Disp: 20 mL, Rfl: 2   Fluticasone  Furoate (ARNUITY ELLIPTA ) 200 MCG/ACT AEPB, Inhale one dose once daily three to seven times a week as directed.  Rinse, gargle, and spit after use., Disp: 30 each, Rfl: 5   meclizine (ANTIVERT) 25 MG tablet, Take 12.5 mg by mouth 3 (three) times daily as needed for dizziness or nausea., Disp: , Rfl:    nitroGLYCERIN  (NITROSTAT ) 0.4 MG SL tablet, Place 1 tablet (0.4 mg total) under the tongue every 5 (five) minutes as needed for chest pain., Disp: 25 tablet, Rfl: 11   Triamcinolone  Acetonide (NASACORT ALLERGY 24HR NA), Place 1 spray into both nostrils daily. Unknown strength, Disp: , Rfl:    XOPENEX  HFA 45 MCG/ACT inhaler, Inhale two puffs every 4-6 hours if needed for cough or wheeze., Disp: 15 g, Rfl: 1   Physical Exam:   BP (!) 145/83 (BP Location: Left Arm, Patient Position: Sitting, Cuff Size: Large)   Pulse 83   Ht 5' 4 (1.626 m)   Wt 165 lb (74.8 kg)   SpO2 95%   BMI 28.32 kg/m   Salient findings:  CN  II-XII intact; facial sensation intact Bilateral eczematoid change; Bilateral EAC clear and TM intact with well pneumatized middle ear spaces Anterior rhinoscopy: Septum intact, modest deviation; bilateral inferior turbinates without significant hypertrophy No lesions of oral cavity/oropharynx; able to easily express saliva from both parotid ducts, unable to feel nodules; no skin lesions noted No obviously palpable neck masses/lymphadenopathy/thyromegaly No respiratory distress or stridor  Seprately Identifiable Procedures:  Prior to initiating any procedures, risks/benefits/alternatives were explained to the patient and verbal consent obtained. None today  Impression & Plans:  Dawn Perkins is a 68 y.o. female with:  1. Parotid nodule   2. Dysfunction of both eustachian tubes   3. Chronic eczematous otitis externa of both ears   4. Sensorineural hearing loss (SNHL) of both ears  Multiple issues today - parotid nodules aspirated prior but have returned on imaging it appears. Quite small, otherwise have been stable for at least 5 years. She does not wish to do anything about them and do suspect these are benign. We decided to observe and f/u in 2 years  From ear standpoint, has b/l tinnitus most likely related to HL and some itching consistent with eczematoid OE. Will try masking, she does not wish for amplification. We also discussed her options otherwise including retraining but does not wish to do that. Will Rx dermotic  and betamethasone   - f/u 2 years with US   See below regarding exact medications prescribed this encounter including dosages and route: Meds ordered this encounter  Medications   Fluocinolone  Acetonide (DERMOTIC ) 0.01 % OIL    Sig: Place 4 drops in ear(s) in the morning and at bedtime for 14 days.    Dispense:  20 mL    Refill:  2   betamethasone  dipropionate 0.05 % cream    Sig: Apply topically 2 (two) times daily.    Dispense:  30 g    Refill:  0       Thank you for allowing me the opportunity to care for your patient. Please do not hesitate to contact me should you have any other questions.  Sincerely, Eldora Blanch, MD Otolaryngologist (ENT), [facility] Phone: [phone] Fax: [phone]  11/27/2023, 2:53 PM   MDM:  Level 4 - [phone] Complexity/Problems addressed: mod - multiple chronic issues Data complexity: high - review of notes, labs; independent CT interpretation and MRI - Morbidity: mod  - Prescription Drug prescribed or managed: y

## 2023-11-27 NOTE — Patient Instructions (Signed)
 Use dermotic  drops - 4 drops twice a day in each ear for 2 weeks Use a pea sized amount of betamethasone  ointment just outside each ear canal opening for 2 weeks

## 2023-12-03 ENCOUNTER — Encounter: Payer: Self-pay | Admitting: Audiology

## 2023-12-05 LAB — LIPID PANEL
Chol/HDL Ratio: 2.2 ratio (ref 0.0–4.4)
Cholesterol, Total: 93 mg/dL — ABNORMAL LOW (ref 100–199)
HDL: 42 mg/dL (ref >39)
LDL Chol Calc (NIH): 27 mg/dL (ref 0–99)
Triglycerides: 142 mg/dL (ref 0–149)
VLDL Cholesterol Cal: 24 mg/dL (ref 5–40)

## 2023-12-11 ENCOUNTER — Telehealth: Payer: Self-pay | Admitting: Cardiology

## 2023-12-11 NOTE — Telephone Encounter (Signed)
 Pt c/o medication issue:  1. Name of Medication:   Alirocumab  (PRALUENT ) 75 MG/ML SOAJ    2. How are you currently taking this medication (dosage and times per day)? As written   3. Are you having a reaction (difficulty breathing--STAT)? No   4. What is your medication issue? Pt called in stating she has been having some dizziness, nauseated, feeling jittery, and BP fluctuating since taking this med. She states she is not sure if this could be related to this medication or not.

## 2023-12-14 ENCOUNTER — Encounter: Payer: Self-pay | Admitting: Cardiology

## 2023-12-15 NOTE — Telephone Encounter (Signed)
 Patient discontinuing Praluent  per Dr Bernie

## 2023-12-16 NOTE — Progress Notes (Signed)
 Results routed to patient through MyChart.

## 2023-12-19 ENCOUNTER — Other Ambulatory Visit: Payer: Self-pay | Admitting: Allergy and Immunology

## 2023-12-31 ENCOUNTER — Encounter: Payer: Self-pay | Admitting: Allergy and Immunology

## 2023-12-31 ENCOUNTER — Ambulatory Visit: Payer: Medicare Other | Admitting: Allergy and Immunology

## 2023-12-31 VITALS — BP 124/68 | HR 82 | Resp 14 | Ht 63.0 in | Wt 168.4 lb

## 2023-12-31 DIAGNOSIS — K219 Gastro-esophageal reflux disease without esophagitis: Secondary | ICD-10-CM

## 2023-12-31 DIAGNOSIS — J3089 Other allergic rhinitis: Secondary | ICD-10-CM

## 2023-12-31 DIAGNOSIS — J4540 Moderate persistent asthma, uncomplicated: Secondary | ICD-10-CM

## 2023-12-31 DIAGNOSIS — G4733 Obstructive sleep apnea (adult) (pediatric): Secondary | ICD-10-CM | POA: Diagnosis not present

## 2023-12-31 MED ORDER — FAMOTIDINE 40 MG PO TABS: 40.0000 mg | ORAL_TABLET | Freq: Two times a day (BID) | ORAL | 5 refills | Status: AC

## 2023-12-31 NOTE — Patient Instructions (Addendum)
  1.  Allergen avoidance measures - dust mite, cockroach  2. Treat and prevent inflammation with the following:   A.  Arnuity 200 - 1 inhalations 3-7 times per week  B.  OTC Nasacort -1 spray each nostril 3-7 times per week  3.  Continue to Treat and prevent reflux/LPR:   A.  Continue off all forms of caffeine consumption  B.  INCREASE Famotidine 40 mg twice a day  4.  If needed:   A.  Xopenex    B.  OTC antihistamine    5. Review recent sleep study  6. Influenza = tamiflu. Covid = Paxlovid /molnupiravir  7. Return to clinic in 12 months or earlier if problem  6. Obtain fall flu vaccine

## 2023-12-31 NOTE — Progress Notes (Signed)
 [place] - [place] - [place] - [place] - [place]   Follow-up Note  Referring Provider: Thurmond Cathlyn LABOR., MD Primary Provider: Thurmond Cathlyn LABOR., MD Date of Office Visit: 12/31/2023  Subjective:   Dawn Perkins (DOB: 26-Sep-1955) is a 68 y.o. female who [facility] in re-evaluation of the following:  HPI: Dawn Perkins returns to this clinic in evaluation of asthma, allergic rhinoconjunctivitis, LPR, and history of eosinophilic esophagitis.  I last saw her in this clinic 31 December 2022.  She thinks her asthma is okay.  Okay for Dawn Perkins means that she never exercises but if she walks a long distance she gets short of breath.  Her requirement for short acting bronchodilator is once a week.  She is only using Arnuity once a week or so.  She is doing pretty well with her nose at this point.  She does use a nasal steroid occasionally.  She has had more problems with reflux.  She is only using her famotidine once a day.  Interestingly, she develops more snoring when she only uses her famotidine once a day.  She had a sleep study recently which showed mild sleep apnea and oxygen desaturation and she is working through options for that issue.  Allergies as of 12/31/2023       Reactions   Effexor Xr [venlafaxine Hcl]    Telmisartan Shortness Of Breath, Rash   Esomeprazole    Rash   Esomeprazole Magnesium    Rash   Losartan Other (See Comments)   High Blood Pressure   Nsaids Other (See Comments)   Stomach problems   Omeprazole    rash   Omeprazole Magnesium    rash   Other    Praluent  [alirocumab ]    Sluggish, depression, side effects   Pravastatin     Muscle aches   Statins Other (See Comments)   Myalgia   Tape    Venlafaxine    Trouble breathing   Nexium I.v. [esomeprazole Sodium] Rash        Medication List    ALPRAZolam  0.25 MG tablet Commonly known as: XANAX  Take 0.25 mg by mouth at bedtime as needed for anxiety.    amLODipine  5 MG tablet Commonly known as: NORVASC  Take 1 tablet (5 mg total) by mouth daily.   Arnuity Ellipta  200 MCG/ACT Aepb Generic drug: Fluticasone  Furoate Inhale one dose once daily three to seven times a week as directed.  Rinse, gargle, and spit after use.   betamethasone  dipropionate 0.05 % cream Apply topically 2 (two) times daily.   clopidogrel  75 MG tablet Commonly known as: PLAVIX  Take 1 tablet (75 mg total) by mouth daily.   COQ-10 PO Take 1 tablet by mouth daily. Unknown strength   cyanocobalamin  1000 MCG tablet Commonly known as: VITAMIN B12 Take 1,000 mcg by mouth daily.   ezetimibe  10 MG tablet Commonly known as: ZETIA  Take 1 tablet (10 mg total) by mouth in the morning.   famotidine 40 MG tablet Commonly known as: PEPCID Take 40 mg by mouth 2 (two) times daily.   meclizine 25 MG tablet Commonly known as: ANTIVERT Take 12.5 mg by mouth 3 (three) times daily as needed for dizziness or nausea.   NASACORT ALLERGY 24HR NA Place 1 spray into both nostrils daily. Unknown strength   nitroGLYCERIN  0.4 MG SL tablet Commonly known as: NITROSTAT  Place 1 tablet (0.4 mg total) under the tongue every 5 (five) minutes as needed for chest pain.  TYLENOL PO Take 1 tablet by mouth as needed (pain). Unknown strenght   Vitamin D 50 MCG (2000 UT) tablet Take 2,000 Units by mouth daily.   Xopenex  HFA 45 MCG/ACT inhaler Generic drug: levalbuterol  Inhale two puffs every 4-6 hours if needed for cough or wheeze.    Past Medical History:  Diagnosis Date   Arm pain    left   Asthma    Diabetes mellitus without complication (HCC)    GERD (gastroesophageal reflux disease)    Head pain    Hyperlipidemia    Hypertension    Neck pain    Renal insufficiency    SVT (supraventricular tachycardia)     Past Surgical History:  Procedure Laterality Date   ABDOMINAL HYSTERECTOMY     APPENDECTOMY     CHOLECYSTECTOMY     TONSILLECTOMY      Review of systems  negative except as noted in HPI / PMHx or noted below:  Review of Systems  Constitutional: Negative.   HENT: Negative.    Eyes: Negative.   Respiratory: Negative.    Cardiovascular: Negative.   Gastrointestinal: Negative.   Genitourinary: Negative.   Musculoskeletal: Negative.   Skin: Negative.   Neurological: Negative.   Endo/Heme/Allergies: Negative.   Psychiatric/Behavioral: Negative.       Objective:   Vitals:   12/31/23 1638  BP: 124/68  Pulse: 82  Resp: 14  SpO2: 99%   Height: 5' 3 (160 cm)  Weight: 168 lb 6.4 oz (76.4 kg)   Physical Exam Constitutional:      Appearance: She is not diaphoretic.  HENT:     Head: Normocephalic.     Right Ear: Tympanic membrane, ear canal and external ear normal.     Left Ear: Tympanic membrane, ear canal and external ear normal.     Nose: Nose normal. No mucosal edema or rhinorrhea.     Mouth/Throat:     Pharynx: Uvula midline. No oropharyngeal exudate.  Eyes:     Conjunctiva/sclera: Conjunctivae normal.  Neck:     Thyroid : No thyromegaly.     Trachea: Trachea normal. No tracheal tenderness or tracheal deviation.  Cardiovascular:     Rate and Rhythm: Normal rate and regular rhythm.     Heart sounds: Normal heart sounds, S1 normal and S2 normal. No murmur heard. Pulmonary:     Effort: No respiratory distress.     Breath sounds: Normal breath sounds. No stridor. No wheezing or rales.  Lymphadenopathy:     Head:     Right side of head: No tonsillar adenopathy.     Left side of head: No tonsillar adenopathy.     Cervical: No cervical adenopathy.  Skin:    Findings: No erythema or rash.     Nails: There is no clubbing.  Neurological:     Mental Status: She is alert.     Diagnostics: Spirometry was performed and demonstrated an FEV1 of 1.32 at 61 % of predicted.   Assessment and Plan:   1. Not well controlled moderate persistent asthma   2. Perennial allergic rhinitis   3. LPRD (laryngopharyngeal reflux disease)    4. Obstructive sleep apnea syndrome    1.  Allergen avoidance measures - dust mite, cockroach  2. Treat and prevent inflammation with the following:   A.  Arnuity 200 - 1 inhalations 3-7 times per week  B.  OTC Nasacort -1 spray each nostril 3-7 times per week  3.  Continue to Treat and prevent reflux/LPR:  A.  Continue off all forms of caffeine consumption  B.  INCREASE Famotidine 40 mg twice a day  4.  If needed:   A.  Xopenex    B.  OTC antihistamine    5. Review recent sleep study  6. Influenza = tamiflu. Covid = Paxlovid /molnupiravir  7. Return to clinic in 12 months or earlier if problem  6. Obtain fall flu vaccine   Dawn Perkins really needs to use her medications a little more consistently.  She has inflammation of her airway and she should use her Arnuity and Nasacort on almost a daily basis.  She has LPR and she has a history of eosinophilic esophagitis and she is having some more reflux issues and interestingly reflux also makes her snore suggesting that she is having a little more issue with LPR and she needs to increase her famotidine to twice a day.  She recently had a sleep study and I will review the results of that sleep study as she is a little confused about how to proceed regarding this issue.  I will contact her once I have the results of that sleep study to review.   Camellia Denis, MD Allergy / Immunology [facility]

## 2024-01-04 ENCOUNTER — Encounter: Payer: Self-pay | Admitting: Allergy and Immunology

## 2024-01-18 ENCOUNTER — Other Ambulatory Visit: Payer: Self-pay

## 2024-01-18 MED ORDER — CLOPIDOGREL BISULFATE 75 MG PO TABS: 75.0000 mg | ORAL_TABLET | Freq: Every day | ORAL | 2 refills | Status: AC

## 2024-01-19 ENCOUNTER — Telehealth: Payer: Self-pay | Admitting: Allergy and Immunology

## 2024-01-19 NOTE — Telephone Encounter (Signed)
 Patient states Arnuity made her teeth very sensitive. She felt that it was affecting her nerves. She stopped using this inhaler and the sensitivity stopped. She would like to know if there is an alternative that Dr. Kozlow can switch her to.

## 2024-01-25 ENCOUNTER — Other Ambulatory Visit (HOSPITAL_COMMUNITY): Payer: Self-pay

## 2024-01-26 ENCOUNTER — Telehealth: Payer: Self-pay

## 2024-01-26 NOTE — Telephone Encounter (Signed)
*  AA  Pharmacy Patient Advocate Encounter   Received notification from Pt Calls Messages that prior authorization for Qvar RediHaler 40MCG/ACT aerosol   is required/requested.   Insurance verification completed.   The patient is insured through [facility].   Per test claim: PA required; PA submitted to above mentioned insurance via Latent Key/confirmation #/EOC [id number] Status is pending   *previously failed Arnuity due to it causing teeth sensitivity

## 2024-01-26 NOTE — Telephone Encounter (Signed)
 Your request has been approved Approved. This drug has been approved under the Member's Medicare Part D benefit. Approved quantity: 10.6 units per 30 day(s). You may fill up to a 90 day supply except for those on Specialty Tier 5, which can be filled up to a 30 day supply. Please call the pharmacy to process the prescription claim. Authorization Expiration12/31/2099

## 2024-02-10 MED ORDER — QVAR REDIHALER 40 MCG/ACT IN AERB: INHALATION_SPRAY | RESPIRATORY_TRACT | 5 refills | Status: AC

## 2024-02-10 NOTE — Addendum Note (Signed)
 Addended by: Kolden Dupee on: 02/10/2024 11:52 AM   Modules accepted: Orders

## 2024-03-18 ENCOUNTER — Telehealth: Payer: Self-pay | Admitting: Cardiology

## 2024-03-18 NOTE — Telephone Encounter (Signed)
 Patient wants a provider switch from Dr. Krasowski to Dr. Edwyna.

## 2024-03-24 ENCOUNTER — Other Ambulatory Visit: Payer: Self-pay | Admitting: Cardiology

## 2024-03-28 DIAGNOSIS — I471 Supraventricular tachycardia, unspecified: Secondary | ICD-10-CM | POA: Insufficient documentation

## 2024-03-28 DIAGNOSIS — E119 Type 2 diabetes mellitus without complications: Secondary | ICD-10-CM | POA: Insufficient documentation

## 2024-03-28 DIAGNOSIS — I10 Essential (primary) hypertension: Secondary | ICD-10-CM | POA: Insufficient documentation

## 2024-03-30 ENCOUNTER — Ambulatory Visit: Attending: Cardiology | Admitting: Cardiology

## 2024-03-30 ENCOUNTER — Ambulatory Visit: Admitting: Cardiology

## 2024-03-30 ENCOUNTER — Encounter: Payer: Self-pay | Admitting: Cardiology

## 2024-03-30 VITALS — BP 128/86 | HR 82 | Ht 64.0 in | Wt 171.8 lb

## 2024-03-30 DIAGNOSIS — R079 Chest pain, unspecified: Secondary | ICD-10-CM | POA: Insufficient documentation

## 2024-03-30 DIAGNOSIS — I10 Essential (primary) hypertension: Secondary | ICD-10-CM | POA: Insufficient documentation

## 2024-03-30 NOTE — Patient Instructions (Signed)
 Please keep a BP log for 2 weeks and send by MyChart or mail.                      Dr. Edwyna [address] [place], [place] [address]  Blood Pressure Record Sheet To take your blood pressure, you will need a blood pressure machine. You can buy a blood pressure machine (blood pressure monitor) at your clinic, drug store, or online. When choosing one, consider: An automatic monitor that has an arm cuff. A cuff that wraps snugly around your upper arm. You should be able to fit only one finger between your arm and the cuff. A device that stores blood pressure reading results. Do not choose a monitor that measures your blood pressure from your wrist or finger. Follow your health care provider's instructions for how to take your blood pressure. To use this form: Get one reading in the morning (a.m.) 1-2 hours after you take any medicines. Get one reading in the evening (p.m.) before supper.   Blood pressure log Date: _______________________  a.m. _____________________(1st reading) HR___________            p.m. _____________________(2nd reading) HR__________  Date: _______________________  a.m. _____________________(1st reading) HR___________            p.m. _____________________(2nd reading) HR__________  Date: _______________________  a.m. _____________________(1st reading) HR___________            p.m. _____________________(2nd reading) HR__________  Date: _______________________  a.m. _____________________(1st reading) HR___________            p.m. _____________________(2nd reading) HR__________  Date: _______________________  a.m. _____________________(1st reading) HR___________            p.m. _____________________(2nd reading) HR__________  Date: _______________________  a.m. _____________________(1st reading) HR___________            p.m. _____________________(2nd reading) HR__________  Date: _______________________  a.m. _____________________(1st reading)  HR___________            p.m. _____________________(2nd reading) HR__________   This information is not intended to replace advice given to you by your health care provider. Make sure you discuss any questions you have with your health care provider. Document Revised: 07/06/2019 Document Reviewed: 07/06/2019 Elsevier Patient Education  2021 Elsevier Inc.   Medication Instructions:  Your physician recommends that you continue on your current medications as directed. Please refer to the Current Medication list given to you today.  *If you need a refill on your cardiac medications before your next appointment, please call your pharmacy*   Lab Work: None ordered If you have labs (blood work) drawn today and your tests are completely normal, you will receive your results only by: MyChart Message (if you have MyChart) OR A paper copy in the mail If you have any lab test that is abnormal or we need to change your treatment, we will call you to review the results.   Testing/Procedures:    [facility] [address] [place], [place] [address] Phone:  [phone]  Stress Echocardiogram Instructions:    1. You may take all of your medications.  2. No food, drink or tobacco products four hours prior to your test.  3. Dress prepared to exercise. Best to wear 2 piece outfit and tennis shoes. Shoes must be closed toe.  4. Please bring all current prescription medications.  Please report to [address] for your test.  If you have questions or concerns about your appointment, you can call the  [facility] at [phone].  If you cannot keep your appointment, please provide 24 hours notification to the Nuclear Lab, to avoid a possible $50 charge to your account.   Follow-Up: At [facility], you and your health needs are our priority.  As part of our continuing mission to provide you with exceptional heart care, we  have created designated Provider Care Teams.  These Care Teams include your primary Cardiologist (physician) and Advanced Practice Providers (APPs -  Physician Assistants and Nurse Practitioners) who all work together to provide you with the care you need, when you need it.  We recommend signing up for the patient portal called MyChart.  Sign up information is provided on this After Visit Summary.  MyChart is used to connect with patients for Virtual Visits (Telemedicine).  Patients are able to view lab/test results, encounter notes, upcoming appointments, etc.  Non-urgent messages can be sent to your provider as well.   To learn more about what you can do with MyChart, go to [email or web address].    Your next appointment:   6 month(s)  The format for your next appointment:   In Person  Provider:   Jennifer Crape, MD   Other Instructions Exercise Stress Echocardiogram An exercise stress echocardiogram is a test to check how well your heart is working. This test uses sound waves and a computer to make pictures of your heart. These pictures will be taken before and after you exercise. For this test, you will walk on a treadmill or ride a bicycle to make your heart beat faster. While you exercise, your heart will be checked with an electrocardiogram (ECG). Your blood pressure will also be checked. You may have this test if: You have chest pain or a heart problem. You had a heart attack or heart surgery not long ago. You have heart valve problems. You have a condition that causes narrowing of the blood vessels that supply your heart. You have a high risk of heart disease and: You are starting a new exercise program. You need to have a big surgery. Tell a doctor about: Any allergies you have. All medicines you are taking. This includes vitamins, herbs, eye drops, creams, and over-the-counter medicines. Any problems you or family members have had with medicines that make you fall asleep  (anesthetic medicines). Any surgeries you have had. Any blood disorders you have. Any medical conditions you have. Whether you are pregnant or may be pregnant. What are the risks? Generally, this is a safe test. However, problems may occur, including: Chest pain. Feeling dizzy or light-headed. Shortness of breath. Increased or irregular heartbeat. Feeling like you may vomit (nausea) or vomiting. Heart attack. This is very rare. What happens before the test? Medicines Ask your doctor about changing or stopping your normal medicines. This is important if you take diabetes medicines or blood thinners. If you use an inhaler, bring it to the test. General instructions Wear comfortable clothes and walking shoes. Follow instructions from your doctor about what you cannot eat or drink before the test. Do not drink or eat anything that has caffeine in it. Stop having caffeine 24 hours before the test. Do not smoke or use products that contain nicotine or tobacco for 4 hours before the test. If you need help quitting, ask your doctor. What happens during the test?  You will take off your clothes from the waist up and put on a hospital gown. Electrodes or patches will be put on your chest.  A blood pressure cuff will be put on your arm. Before you exercise, a computer will make a picture of your heart. To do this: You will lie down and a gel will be put on your chest. A wand will be moved over the gel. Sound waves from the wand will go to the computer to make the picture. Then, you will start to exercise. You may walk on a treadmill or pedal a bicycle. Your blood pressure and heart rhythm will be checked while you exercise. The exercise will get harder or faster. You will exercise until: Your heart reaches a certain level. You are too tired to go on. You cannot go on because of chest pain, weakness, or dizziness. You will lie down right away so another picture of your heart can be  taken. The procedure may vary among doctors and hospitals. What can I expect after the test? After your test, it is common to have: Mild soreness. Mild tiredness. Your heart rate and blood pressure will be checked until they return to your normal levels. You should not have any new symptoms after this test. Follow these instructions at home: If your doctor says that you can, you may: Eat what you normally eat. Do your normal activities. Take over-the-counter and prescription medicines only as told by your doctor. Keep all follow-up visits. It is up to you to get the results of your test. Ask how to get your results when they are ready. Contact a doctor if: You feel dizzy or light-headed. You have a fast or irregular heartbeat. You feel like you may vomit or you vomit. You have a headache. You feel short of breath. Get help right away if: You develop pain or pressure: In your chest. In your jaw or neck. Between your shoulders. That goes down your left arm. You faint. You have trouble breathing. These symptoms may be an emergency. Get medical help right away. Call your local emergency services (911 in the [place] Do not wait to see if the symptoms will go away. Do not drive yourself to the hospital. Summary This is a test that checks how well your heart is working. Follow instructions about what you cannot eat or drink before the test. Ask your doctor if you should take your normal medicines before the test. Stop having caffeine 24 hours before the test. Do not smoke or use products with nicotine or tobacco in them for 4 hours before the test. During the test, your blood pressure and heart rhythm will be checked while you exercise. This information is not intended to replace advice given to you by your health care provider. Make sure you discuss any questions you have with your health care provider. Document Revised: 11/28/2020 Document Reviewed: 11/08/2019 Elsevier Patient  Education  2022 [employer].

## 2024-03-30 NOTE — Progress Notes (Signed)
 " Cardiology Office Note:    Date:  03/30/2024   ID:  Dawn Perkins, DOB 12/04/1955, MRN [id number]  PCP:  Thurmond Cathlyn LABOR., MD  Cardiologist:  Jennifer JONELLE Crape, MD   Referring MD: Thurmond Cathlyn LABOR., MD    ASSESSMENT:    1. Essential hypertension    PLAN:    In order of problems listed above:  Atherosclerotic vascular disease: Secondary prevention stressed with the patient.  Importance of compliance with diet medication stressed and patient verbalized standing.  She was advised to walk at least half an hour a day on a daily basis. Essential hypertension: Blood pressure is stable and diet was emphasized.  Her blood pressure is much better at home.  Lifestyle modification urged.  Salt intake issues were discussed. Mixed dyslipidemia: She says she is intolerant to statins she has gout and therefore I cannot give her Nexletol .  I will refer her to the lipid clinic as she has issues with both injectable PCSK9. Obesity: Weight reduction stressed diet emphasized and she promises to do better. Chest pain: She went to the emergency room for chest pain.  Currently she does not have any chest pain.  More of it was a stabbing sensation in the back and CT was unremarkable.  For reassurance and in view of risk factors I will do exercise stress echo. Patient will be seen in follow-up appointment in 6 months or earlier if the patient has any concerns.    Medication Adjustments/Labs and Tests Ordered: Current medicines are reviewed at length with the patient today.  Concerns regarding medicines are outlined above.  Orders Placed This Encounter  Procedures   EKG 12-Lead   No orders of the defined types were placed in this encounter.    No chief complaint on file.    History of Present Illness:    Dawn Perkins is a 68 y.o. female.  Patient has past medical history of atherosclerotic vascular disease, cerebrovascular disease with cerebrovascular and carotid artery disease.  She has past  medical history of essential hypertension and mixed dyslipidemia.  She leads a sedentary lifestyle.  She denies any chest pain orthopnea or PND.  At the time of my evaluation, the patient is alert awake oriented and in no distress.  Past Medical History:  Diagnosis Date   Alopecia 05/13/2015   Anxiety 09/21/2023   Arm pain    left   Asthma    Atherosclerosis of both carotid arteries 06/25/2020   Formatting of this note might be different from the original. 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  Negative Carotid doppler 2021 at [facility]     Capsulitis of metatarsophalangeal (MTP) joint of left foot 04/17/2016   Carotid artery stenosis right side up to 70% stenosis by CT 11/08/2021   Chest pain with moderate risk for cardiac etiology 06/12/2015   Negative GXT 2018, normal echo 2017     Chronic pain in left foot 04/17/2016   Coronary artery disease involving native coronary artery of native heart without angina pectoris 12/23/2022   CT 8/23024     Diabetes mellitus without complication (HCC)    Diastasis recti 07/18/2021   Diverticulosis 12/23/2022   Dizziness 06/12/2015   Dyslipidemia 06/08/2019   Esophagitis, 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  2001.     Family history of cerebellar ataxia 06/30/2023   Gastro-esophageal reflux disease with esophagitis 05/13/2015   GERD (gastroesophageal reflux disease)    Hardening of the aorta (main artery of the heart) 07/25/2021   Head pain    History of non anemic vitamin B12 deficiency 03/18/2022   Hyperlipidemia    Hypertension    Hypokalemia 12/23/2022   Idiopathic chronic gout without tophus 07/18/2021   Malaise and fatigue 06/30/2023   Mild  intermittent asthma without complication 03/17/2022   Myalgia due to statin 01/11/2021   NASH (nonalcoholic steatohepatitis) 07/25/2021   Neck pain    Non-insulin dependent type 2 diabetes mellitus (HCC) 06/08/2019   Osteopenia of multiple sites 07/08/2023   Palpitations 06/12/2015   Panic disorder 06/25/2020   Formatting of this note might be different from the original.  Mild     Paresthesia of left foot 09/23/2021   Perennial allergic rhinitis 05/13/2015   Prediabetes 05/13/2015   PSVT (paroxysmal supraventricular tachycardia) 05/13/2015   Formatting of this note might be different from the original.  2001. [place].     Renal insufficiency    Seborrheic dermatitis 05/13/2015   Soft tissue mass 08/20/2021   Suspected sleep apnea 09/21/2023   SVT (supraventricular tachycardia)    Syncope 07/01/2015   Formatting of this note is different from the original.  Echo 06/27/2015: Study Conclusions        - Left ventricle: The cavity size was normal. Wall thickness was      normal. Systolic function was normal. The estimated ejection      fraction was in the range of 60% to 65%. Wall motion was normal;      there were no regional wall motion abnormalities. Doppler      parameters are consistent with a   Tightness of heel cord, left 04/20/2016   Venous insufficiency (chronic) (peripheral) 06/25/2020   Vitamin B12 deficiency 03/18/2022   Vitamin D deficiency 05/13/2015    Past Surgical History:  Procedure Laterality Date   ABDOMINAL HYSTERECTOMY     APPENDECTOMY     CHOLECYSTECTOMY     TONSILLECTOMY      Current Medications: Active Medications[1]   Allergies:   Effexor xr [venlafaxine hcl], Telmisartan, Esomeprazole, Esomeprazole magnesium, Losartan, Nsaids, Omeprazole, Omeprazole magnesium, Other, Praluent  [alirocumab ], Pravastatin , Statins, Tape, Venlafaxine, and Nexium i.v. [esomeprazole sodium]   Social History   Socioeconomic History   Marital status: Married    Spouse name:  Elgin   Number of children: 2   Years of education: 12+   Highest education level: Not on file  Occupational History   Occupation: [employer]  Tobacco Use   Smoking status: Never   Smokeless tobacco: Never  Vaping Use   Vaping status: Never Used  Substance and Sexual Activity   Alcohol use: No   Drug use: No   Sexual activity: Not on file  Other Topics Concern   Not on file  Social History Narrative   Lives with spouse   Caffeine use:  caf free diet sodas 1-1.5 daily   Social Drivers of Health   Tobacco Use: Low Risk (03/30/2024)   Patient History    Smoking Tobacco Use: Never    Smokeless Tobacco Use: Never    Passive Exposure: Not on file  Financial Resource Strain: Not on file  Food Insecurity: Low Risk (12/08/2022)   Received from  [facility]   Epic    Within the past 12 months, you worried that your food would run out before you got money to buy more: Never true    Within the past 12 months, the food you bought just didn't last and you didn't have money to get more. : Never true  Transportation Needs: No Transportation Needs (12/08/2022)   Received from [facility]    In the past 12 months, has lack of reliable transportation kept you from medical appointments, meetings, work or from getting things needed for daily living? : No  Physical Activity: Not on file  Stress: Not on file  Social Connections: Not on file  Depression (EYV7-0): Not on file  Alcohol Screen: Not on file  Housing: Low Risk (12/08/2022)   Received from [facility]   Epic    What is your living situation today?: I have a steady place to live    Think about the place you live. Do you have problems with any of the following? Choose all that apply:: None/None on this list  Utilities: Low Risk (12/08/2022)   Received from [facility]   Utilities    In the past 12 months has the electric, gas, oil, or water company threatened to shut off services in your home? : No   Health Literacy: Not on file     Family History: The patient's family history includes Alzheimer's disease in her mother; Asthma in her brother; Diabetes in her brother, brother, father, and mother; Heart disease in her brother, brother, and father; Heart failure in her brother, father, and mother; Hypertension in her brother, father, and mother; Stroke in her mother. There is no history of Neuropathy, Multiple sclerosis, Migraines, or Ataxia.  ROS:   Please see the history of present illness.    All other systems reviewed and are negative.  EKGs/Labs/Other Studies Reviewed:    The following studies were reviewed today: .SABRAEKG Interpretation Date/Time:  Wednesday March 30 2024 10:09:36 EST Ventricular Rate:  82 PR Interval:  144 QRS Duration:  74 QT Interval:  372 QTC Calculation: 434 R Axis:   73  Text Interpretation: Normal sinus rhythm Normal ECG When compared with ECG of 26-Oct-2023 14:37, Premature ventricular complexes are no longer Present Confirmed by Edwyna Backers [phone]) on 03/30/2024 10:15:19 AM     Recent Labs: No results found for requested labs within last 365 days.  Recent Lipid Panel    Component Value Date/Time   CHOL 93 (L) 12/04/2023 1113   TRIG 142 12/04/2023 1113   HDL 42 12/04/2023 1113   CHOLHDL 2.2 12/04/2023 1113   LDLCALC 27 12/04/2023 1113    Physical Exam:    VS:  BP (!) 140/90   Pulse 82   Ht 5' 4 (1.626 m)   Wt 171 lb 12.8 oz (77.9 kg)   SpO2 97%   BMI 29.49 kg/m     Wt Readings from Last 3 Encounters:  03/30/24 171 lb 12.8 oz (77.9 kg)  12/31/23 168 lb 6.4 oz (76.4 kg)  11/27/23 165 lb (74.8 kg)     GEN: Patient is in no acute distress HEENT: Normal NECK: No JVD; No carotid bruits LYMPHATICS: No lymphadenopathy CARDIAC: Hear sounds regular, 2/6 systolic murmur at the apex. RESPIRATORY:  Clear to auscultation without rales, wheezing or rhonchi  ABDOMEN: Soft, non-tender, non-distended MUSCULOSKELETAL:  No edema; No  deformity  SKIN: Warm and dry NEUROLOGIC:  Alert and oriented x 3 PSYCHIATRIC:  Normal affect  Signed, Jennifer JONELLE Crape, MD  03/30/2024 10:43 AM    [facility]     [1]  Current Meds  Medication Sig   Acetaminophen (TYLENOL PO) Take 1 tablet by mouth as needed (pain). Unknown strenght   ALPRAZolam  (XANAX ) 0.25 MG tablet Take 0.25 mg by mouth at bedtime as needed for anxiety.   amLODipine  (NORVASC ) 5 MG tablet Take 1 tablet (5 mg total) by mouth daily.   beclomethasone (QVAR  REDIHALER) 40 MCG/ACT inhaler One inhalation 3-7 times per week   betamethasone  dipropionate 0.05 % cream Apply topically 2 (two) times daily.   Cholecalciferol (VITAMIN D) 2000 units tablet Take 2,000 Units by mouth daily.   clopidogrel  (PLAVIX ) 75 MG tablet Take 1 tablet (75 mg total) by mouth daily.   Coenzyme Q10 (COQ-10 PO) Take 1 tablet by mouth daily. Unknown strength   cyanocobalamin  (VITAMIN B12) 1000 MCG tablet Take 1,000 mcg by mouth daily.   ezetimibe  (ZETIA ) 10 MG tablet Take 1 tablet (10 mg total) by mouth in the morning.   famotidine  (PEPCID ) 40 MG tablet Take 1 tablet (40 mg total) by mouth 2 (two) times daily.   Fluticasone  Furoate (ARNUITY ELLIPTA ) 200 MCG/ACT AEPB Inhale one dose once daily three to seven times a week as directed.  Rinse, gargle, and spit after use.   meclizine (ANTIVERT) 25 MG tablet Take 12.5 mg by mouth 3 (three) times daily as needed for dizziness or nausea.   nitroGLYCERIN  (NITROSTAT ) 0.4 MG SL tablet Place 1 tablet (0.4 mg total) under the tongue every 5 (five) minutes as needed for chest pain.   Triamcinolone  Acetonide (NASACORT ALLERGY 24HR NA) Place 1 spray into both nostrils daily. Unknown strength   XOPENEX  HFA 45 MCG/ACT inhaler Inhale two puffs every 4-6 hours if needed for cough or wheeze.   "

## 2024-04-01 ENCOUNTER — Other Ambulatory Visit: Payer: Self-pay | Admitting: *Deleted

## 2024-04-01 MED ORDER — XOPENEX HFA 45 MCG/ACT IN AERO: INHALATION_SPRAY | RESPIRATORY_TRACT | 1 refills | Status: AC

## 2024-04-07 ENCOUNTER — Encounter: Payer: Self-pay | Admitting: Allergy and Immunology

## 2024-04-25 ENCOUNTER — Other Ambulatory Visit: Payer: Self-pay | Admitting: Cardiology

## 2024-04-27 ENCOUNTER — Ambulatory Visit: Attending: Cardiology | Admitting: Pharmacist Clinician (PhC)/ Clinical Pharmacy Specialist

## 2024-04-27 ENCOUNTER — Encounter: Payer: Self-pay | Admitting: Pharmacist Clinician (PhC)/ Clinical Pharmacy Specialist

## 2024-04-27 DIAGNOSIS — E785 Hyperlipidemia, unspecified: Secondary | ICD-10-CM | POA: Diagnosis not present

## 2024-04-27 NOTE — Assessment & Plan Note (Signed)
 Assessment: Patient with ASCVD not at LDL goal of < 70 Most recent LDL 134 on 04/20/24 Has been compliant with ezetimibe  10 mg Not able to tolerate statins or alirocumab  secondary to myalgias Reviewed options for lowering LDL cholesterol, including inclisiran and colestipol.  Discussed mechanisms of action, dosing, side effects, potential decreases in LDL cholesterol and costs.  Also reviewed potential options for patient assistance.  Plan: Patient would like to think further before making a decision She will reach out to me via MyChart when she is ready.

## 2024-04-27 NOTE — Progress Notes (Signed)
 "  Office Visit    Patient Name: Dawn Perkins Date of Encounter: 04/27/2024  Primary Care Provider:  Thurmond Cathlyn LABOR., MD Primary Cardiologist:  None  Chief Complaint    Hyperlipidemia   Significant Past Medical History   CAD 25-49% stenosis in LM and LAD per scans   HTN Controlled at last visit  DM2 1/26 A1c 6.5 - new diagnosis (sweets - no will power)  gout Idiopathic chronic  NASH      Allergies[1]  History of Present Illness    Dawn Perkins is a 69 y.o. female patient of Dr Edwyna, in the office today to discuss options for cholesterol management.  I actually saw her back in 2023 for lipids, at that time still trying statins to see if she could tolerate.  She was eventually started on Praluent , with which she did well for the first few doses.  Then she reported myalgias, UTI's and personality changes.  She stopped the medication and all symptoms resolved.    Insurance Carrier:  [employer] Part F/G  LDL Cholesterol goal:  LDL < 70  Current Medications:  ezetimibe  10 mg daily   Previously tried:  rosuvastatin, pravastatin , atorvastatin, alirocumab  - all caused myalgias  Avoid bempedoic acid , pt has chronic gout, last uric acid level was elevated  Family Hx:   father had CABG x 4, died at 44, mother had CHF, stroke (left eye affected), died at 61; oldest brother had DM1 and cardiac issues, died at 32; next brother with heart disease pacemaker dependent, younger brother DM2; 2 daughter both healthy from heart standpoint   Social Hx: Tobacco: no Alcohol:    no  Diet:  has recently gained some weight, not eating as well over the holidays.  Admits to snacking more frequently      Exercise: none - recently purchased treadmill, but admits it's still in the box   Accessory Clinical Findings   04/20/24 -  TC 210, TG 197, HDL 42, LDL 134  Lab Results  Component Value Date   CHOL 93 (L) 12/04/2023   HDL 42 12/04/2023   LDLCALC 27 12/04/2023   TRIG 142 12/04/2023    CHOLHDL 2.2 12/04/2023    No results found for: LIPOA  Lab Results  Component Value Date   ALT 22 11/10/2022   AST 19 11/10/2022   Lab Results  Component Value Date   CREATININE 0.83 11/04/2022   BUN 14 11/04/2022   NA 137 11/04/2022   K 4.2 11/04/2022   CL 97 11/04/2022   CO2 28 11/04/2022   No results found for: HGBA1C  Home Medications    Current Outpatient Medications  Medication Sig Dispense Refill   Acetaminophen (TYLENOL PO) Take 1 tablet by mouth as needed (pain). Unknown strenght     ALPRAZolam  (XANAX ) 0.25 MG tablet Take 0.25 mg by mouth at bedtime as needed for anxiety.     amLODipine  (NORVASC ) 5 MG tablet TAKE ONE TABLET BY MOUTH DAILY 90 tablet 2   beclomethasone (QVAR  REDIHALER) 40 MCG/ACT inhaler One inhalation 3-7 times per week 10.6 g 5   betamethasone  dipropionate 0.05 % cream Apply topically 2 (two) times daily. 30 g 0   Cholecalciferol (VITAMIN D) 2000 units tablet Take 2,000 Units by mouth daily.     clopidogrel  (PLAVIX ) 75 MG tablet Take 1 tablet (75 mg total) by mouth daily. 90 tablet 2   Coenzyme Q10 (COQ-10 PO) Take 1 tablet by mouth daily. Unknown strength     cyanocobalamin  (  VITAMIN B12) 1000 MCG tablet Take 1,000 mcg by mouth daily.     ezetimibe  (ZETIA ) 10 MG tablet Take 1 tablet (10 mg total) by mouth in the morning. 90 tablet 1   famotidine  (PEPCID ) 40 MG tablet Take 1 tablet (40 mg total) by mouth 2 (two) times daily. 60 tablet 5   Fluticasone  Furoate (ARNUITY ELLIPTA ) 200 MCG/ACT AEPB Inhale one dose once daily three to seven times a week as directed.  Rinse, gargle, and spit after use. 30 each 5   meclizine (ANTIVERT) 25 MG tablet Take 12.5 mg by mouth 3 (three) times daily as needed for dizziness or nausea.     nitroGLYCERIN  (NITROSTAT ) 0.4 MG SL tablet Place 1 tablet (0.4 mg total) under the tongue every 5 (five) minutes as needed for chest pain. 25 tablet 11   Triamcinolone  Acetonide (NASACORT ALLERGY 24HR NA) Place 1 spray into both  nostrils daily. Unknown strength     XOPENEX  HFA 45 MCG/ACT inhaler Inhale two puffs every 4-6 hours if needed for cough or wheeze. 15 g 1   No current facility-administered medications for this visit.     Assessment & Plan    Dyslipidemia Assessment: Patient with ASCVD not at LDL goal of < 70 Most recent LDL 134 on 04/20/24 Has been compliant with ezetimibe  10 mg Not able to tolerate statins or alirocumab  secondary to myalgias Reviewed options for lowering LDL cholesterol, including inclisiran and colestipol.  Discussed mechanisms of action, dosing, side effects, potential decreases in LDL cholesterol and costs.  Also reviewed potential options for patient assistance.  Plan: Patient would like to think further before making a decision She will reach out to me via MyChart when she is ready.     Allean Mink, PharmD CPP [facility] [address]   [place], [place] [address] [phone]  04/27/2024, 12:40 PM       [1]  Allergies Allergen Reactions   Effexor Xr [Venlafaxine Hcl]    Telmisartan Shortness Of Breath and Rash   Esomeprazole     Rash   Esomeprazole Magnesium     Rash   Losartan Other (See Comments)    High Blood Pressure   Nsaids Other (See Comments)    Stomach problems   Omeprazole     rash   Omeprazole Magnesium     rash   Other    Praluent  [Alirocumab ]     Sluggish, depression, side effects   Pravastatin      Muscle aches   Statins Other (See Comments)    Myalgia   Tape    Venlafaxine     Trouble breathing   Nexium I.V. [Esomeprazole Sodium] Rash   "

## 2024-04-27 NOTE — Patient Instructions (Signed)
 Your Results:             Your most recent labs Goal  Total Cholesterol 210 < 200  Triglycerides 197 < 150  HDL (happy/good cholesterol) 42 > 40  LDL (lousy/bad cholesterol 134 < 70    Please reach out to me if you decide to move forward with this.   You can use MyChart or call me at [phone].     Leqvio (inclisrin)   How is it given? An injection done by a healthcare professional in-clinic  How often do you take it? 1st and 2nd dose 3 months apart, then every 6 months   White kind of side effects may you experience? -Bronchitis-like symptoms for 36-48 hours following a dose, typically only for the first few doses -Injection site reactions  How much will this lower your LDL? Are there other benefits? LDL reduction of up to 45-60%   How much will this cost? Comparable, however, there are [employer] grants and manufacture copay cards that can help reduce the price  (this would be free on your current health plan)       Thank you for choosing [facility]

## 2024-06-07 ENCOUNTER — Ambulatory Visit

## 2025-01-02 ENCOUNTER — Ambulatory Visit: Admitting: Allergy and Immunology
